# Patient Record
Sex: Male | Born: 1960 | Race: White | Hispanic: No | Marital: Married | Smoking: Former smoker
Health system: Southern US, Community
[De-identification: ages and names within clinical notes are randomized; demographics above are authoritative.]

## PROBLEM LIST (undated history)

## (undated) DIAGNOSIS — N189 Chronic kidney disease, unspecified: Secondary | ICD-10-CM

## (undated) DIAGNOSIS — I10 Essential (primary) hypertension: Secondary | ICD-10-CM

## (undated) DIAGNOSIS — G629 Polyneuropathy, unspecified: Secondary | ICD-10-CM

## (undated) DIAGNOSIS — Z87442 Personal history of urinary calculi: Secondary | ICD-10-CM

## (undated) DIAGNOSIS — K219 Gastro-esophageal reflux disease without esophagitis: Secondary | ICD-10-CM

## (undated) DIAGNOSIS — M109 Gout, unspecified: Secondary | ICD-10-CM

## (undated) HISTORY — PX: CARPAL TUNNEL RELEASE: SHX101

## (undated) HISTORY — PX: BACK SURGERY: SHX140

---

## 2005-04-06 ENCOUNTER — Inpatient Hospital Stay (HOSPITAL_COMMUNITY): Admission: RE | Admit: 2005-04-06 | Discharge: 2005-04-07 | Payer: Self-pay | Admitting: Specialist

## 2010-01-29 ENCOUNTER — Encounter: Admission: RE | Admit: 2010-01-29 | Discharge: 2010-01-29 | Payer: Self-pay | Admitting: Specialist

## 2010-10-16 NOTE — Op Note (Signed)
NAMEGALILEO, COLELLO              ACCOUNT NO.:  [id number]   MEDICAL RECORD NO.:  [id number]          PATIENT TYPE:  INP   LOCATION:  1511                         FACILITY:  [facility]   PHYSICIAN:  Jene Every, M.D.    DATE OF BIRTH:  02-27-1961   DATE OF PROCEDURE:  04/06/2005  DATE OF DISCHARGE:  04/07/2005                                 OPERATIVE REPORT   PREOPERATIVE DIAGNOSIS:  Spinal stenosis at L4-5.   POSTOPERATIVE DIAGNOSIS:  Spinal stenosis at L4-5, stenosis 3-4.   PROCEDURE PERFORMED:  1.  Lumbar decompression, bilateral hemilaminotomy, lateral recess      decompression, microdiskectomy at L4-5, right.  2.  Bilateral decompression at L3-4, microdiskectomy at 3-4, left.   BRIEF HISTORY AND INDICATIONS:  This is a 50 year old male, obese who has  had bilateral lower extremity radicular pains.  Stenosis at 4-5 was noted.  EHL weakness bilaterally.  He was also noted to have increasing left-sided  symptoms.  He had been refractory to conservative treatment including  epidural steroid injections, activity modification, etc.  His MRI indicated  lateral recess stenosis at 4-5 multifactorial with central disk protrusion.  The patient despite conservative treatment did not progress in physical  therapy and therefore was indicated for decompression.  The risks and  benefits were discussed including bleeding, infection, damage to vascular  structures, CSF leakage, epidural fibrosis, __________  need for fusion and  suture, anesthetic complications, etc.  The patient preoperatively was noted  to have fairly significant increase in his lumbosacral angle.   TECHNIQUE:  The patient in supine position, after induction of anesthesia, 2  g Kefzol, he was placed prone on the [place] frame.  All prominences were  well padded.  The abdomen was prepped and draped free.  Two 18-guage spinal  needles were utilized to localize the 4-5 interspace.  The initial needles  identified the space at  3-4.  The incision was made from the spinous process  from 3 to 5.  Subcutaneous tissue was dissected.  Bovie electrocautery was  utilized for hemostasis.  Dorsal lumbar fascia identified and divided along  the skin incision.  Paraspinous muscles were elevated from the lamina of 4-  5, partially in 3.  Placed a McCullough retractor at the region of the  presumed 4-5 interspace.  Radiograph obtained with a McCullough retractor  indicated that overseen levels were at L3-4.  There was a very small  interlaminar space there.  I digitally palpated that.  I digitally palpated  the level below.  I then moved the [facility] retractor caudad for the 4-5  interspace.  The extra long McCullough retractors were not available.  There  was some infolding from the paraspinous musculature.  I therefore brought in  the operating microscope.   I turned my attention toward the first.  I opened up the lamina.  The  cephalad edge of the lamina 5 with a 2 mm Kerrison.  Stenosis noted here,  ligamentum flavum hypertrophy.  Pars was protected.  There was fairly  significant amount of recess stenosis noted here.  In a similar fashion, I  opened up the contralateral side as well.  I performed a foraminotomy and  decompressed the lateral recess of the 2 mm Kerrison.  I examined the disk  on the right.  There was a small focal protrusion here and this was  retrieved with a micropituitary.  The remainder of the disk, however,  appeared to be intact.  Not necessarily consistent with that seen on the  initial MRI of lower central disk protrusion.  I therefore obtained  radiograph.  It was indeterminate as to the overlying level.  I subsequently  extended the incision and readjusted the [facility] retractor.  I felt that  perhaps due to the obliqueness of the lamina that decompression was required  at the level below.  Again, there was a fairly oblique 4 lamina here due to  this increased lumbosacral angle and its  anatomy; therefore, I opened up the  interspace at the level below at 4-5, decompressed the lateral recess,  performed foraminotomies.  There was fairly significant stenosis noted here  a well, right greater than left.  Bipolar electrocautery was utilized to  achieve hemostasis.  We protected neural elements at all times.  We undercut  the facet with a 2 mm Kerrison.  Performed foraminotomies of 5.  There was  paracentral to the right disk herniation here.  I performed annulotomy and  removed copious portions of disk material from the disk space with an  upbiting and straight pituitary with multiple passes.  This decompressed the  5 root on the right.  Carrying this cephalad down to the ligamentum of  flavum.  I preserved the pars.  There was some stenosis out into the 4  foramen.  Again, I evaluated it from above, extended the foraminotomy at 4  on either side of the 4 lamina.  I felt that that could be preserved.  I  used a Hockey stick probe cephalad and caudad and it was fairly opened.  There was at least 1 cm of resurgence of the 5 root medial to the pedicle  without difficulty or tension.  There was no CSF leakage or active bleeding.  Confirmatory radiograph was obtained and his 4-5 space was more consistent  with a traditional 5-1 space due to the inclination of the lumbosacral  angle.  His interspace at 5-1 was essentially vertical oblique.  Decompression was performed bilaterally.  No CSF leakage or active bleeding.  Copiously irrigated all disk space with antibiotic irrigation, 3-4 and 4-5.  Bone wax was placed on the cancellous surfaces.  Thrombin-soaked Gelfoam in  the interlaminar spaces.  On removal of the McCullough retractor,  paraspinous muscles were inspected, no evidence of active bleeding.  The  dorsolumbar fascia reapproximated with #1 Vicryl interrupted figure-of-eight  sutures.  The subcutaneous tissue was reapproximated with 2-0 Vicodin simple sutures.  The skin was  reapproximated with staples.  The wound was dressed  sterilely.  Irrigated paraspinous musculature as well.  The patient was  placed supine on the hospital bed, extubated, without difficulty, and  transported to the recovery room in satisfactory condition.  The patient  tolerated the procedure well, no complications.      Jene Every, M.D.  Electronically Signed     JB/MEDQ  D:  04/20/2005  T:  04/20/2005  Job:  [id number]

## 2011-07-21 ENCOUNTER — Ambulatory Visit (HOSPITAL_COMMUNITY)
Admission: RE | Admit: 2011-07-21 | Discharge: 2011-07-21 | Disposition: A | Discharge status: Home / Self Care | Payer: Worker's Compensation | Source: Ambulatory Visit | Attending: Urology | Admitting: Urology

## 2011-07-21 ENCOUNTER — Encounter (HOSPITAL_COMMUNITY): Payer: Self-pay | Admitting: Pharmacy Technician

## 2011-07-21 ENCOUNTER — Encounter (HOSPITAL_COMMUNITY)
Admission: RE | Disposition: A | Discharge status: Home / Self Care | Payer: Self-pay | Source: Ambulatory Visit | Attending: Urology

## 2011-07-21 ENCOUNTER — Encounter (HOSPITAL_COMMUNITY): Payer: Self-pay

## 2011-07-21 ENCOUNTER — Encounter (HOSPITAL_COMMUNITY): Payer: Self-pay | Admitting: Anesthesiology

## 2011-07-21 ENCOUNTER — Ambulatory Visit (HOSPITAL_COMMUNITY): Payer: Worker's Compensation | Admitting: Anesthesiology

## 2011-07-21 ENCOUNTER — Ambulatory Visit (HOSPITAL_COMMUNITY): Payer: Worker's Compensation

## 2011-07-21 ENCOUNTER — Other Ambulatory Visit: Payer: Self-pay

## 2011-07-21 ENCOUNTER — Encounter (HOSPITAL_COMMUNITY)
Admission: RE | Admit: 2011-07-21 | Discharge: 2011-07-21 | Disposition: A | Discharge status: Home / Self Care | Payer: Worker's Compensation | Source: Ambulatory Visit | Attending: Urology | Admitting: Urology

## 2011-07-21 DIAGNOSIS — E119 Type 2 diabetes mellitus without complications: Secondary | ICD-10-CM | POA: Insufficient documentation

## 2011-07-21 DIAGNOSIS — Z01812 Encounter for preprocedural laboratory examination: Secondary | ICD-10-CM | POA: Insufficient documentation

## 2011-07-21 DIAGNOSIS — N201 Calculus of ureter: Secondary | ICD-10-CM

## 2011-07-21 DIAGNOSIS — Z01810 Encounter for preprocedural cardiovascular examination: Secondary | ICD-10-CM | POA: Insufficient documentation

## 2011-07-21 DIAGNOSIS — Z79899 Other long term (current) drug therapy: Secondary | ICD-10-CM | POA: Insufficient documentation

## 2011-07-21 HISTORY — PX: STONE EXTRACTION WITH BASKET: SHX5318

## 2011-07-21 HISTORY — DX: Essential (primary) hypertension: I10

## 2011-07-21 HISTORY — DX: Polyneuropathy, unspecified: G62.9

## 2011-07-21 HISTORY — DX: Chronic kidney disease, unspecified: N18.9

## 2011-07-21 LAB — BASIC METABOLIC PANEL
BUN: 15 mg/dL (ref 6–23)
CO2: 27 mEq/L (ref 19–32)
Calcium: 9.6 mg/dL (ref 8.4–10.5)
Chloride: 106 mEq/L (ref 96–112)
Creatinine, Ser: 0.84 mg/dL (ref 0.50–1.35)
GFR calc Af Amer: >90 mL/min (ref >90)
Glucose, Bld: 111 mg/dL — ABNORMAL HIGH (ref 70–99)
Potassium: 4.6 mEq/L (ref 3.5–5.1)
Sodium: 141 mEq/L (ref 135–145)

## 2011-07-21 LAB — SURGICAL PCR SCREEN: MRSA, PCR: NEGATIVE

## 2011-07-21 LAB — GLUCOSE, CAPILLARY: Glucose-Capillary: 86 mg/dL (ref 70–99)

## 2011-07-21 LAB — HEMOGLOBIN AND HEMATOCRIT, BLOOD
HCT: 40.7 % (ref 39.0–52.0)
Hemoglobin: 13.5 g/dL (ref 13.0–17.0)

## 2011-07-21 SURGERY — CYSTOURETEROSCOPY, WITH RETROGRADE PYELOGRAM AND STENT INSERTION
Anesthesia: General | Site: Ureter | Laterality: Left | Wound class: Clean Contaminated

## 2011-07-21 MED ORDER — GLYCOPYRROLATE 0.2 MG/ML IJ SOLN
INTRAMUSCULAR | Status: AC
  Filled 2011-07-21: qty 1

## 2011-07-21 MED ORDER — FENTANYL CITRATE 0.05 MG/ML IJ SOLN
INTRAMUSCULAR | Status: AC
  Filled 2011-07-21: qty 2

## 2011-07-21 MED ORDER — SODIUM CHLORIDE 0.9 % IR SOLN
Status: DC | PRN
  Administered 2011-07-21: 3000 mL

## 2011-07-21 MED ORDER — LIDOCAINE HCL 1 % IJ SOLN
INTRAMUSCULAR | Status: DC | PRN
  Administered 2011-07-21: 40 mg via INTRADERMAL

## 2011-07-21 MED ORDER — MIDAZOLAM HCL 2 MG/2ML IJ SOLN
INTRAMUSCULAR | Status: AC
  Filled 2011-07-21: qty 2

## 2011-07-21 MED ORDER — PROPOFOL 10 MG/ML IV BOLUS
INTRAVENOUS | Status: DC | PRN
  Administered 2011-07-21: 100 mg via INTRAVENOUS
  Administered 2011-07-21: 200 mg via INTRAVENOUS
  Administered 2011-07-21: 100 mg via INTRAVENOUS

## 2011-07-21 MED ORDER — NEOSTIGMINE METHYLSULFATE 1 MG/ML IJ SOLN
INTRAMUSCULAR | Status: DC | PRN
  Administered 2011-07-21: 2 mg via INTRAVENOUS

## 2011-07-21 MED ORDER — PROPOFOL 10 MG/ML IV EMUL
INTRAVENOUS | Status: AC
  Filled 2011-07-21: qty 20

## 2011-07-21 MED ORDER — FENTANYL CITRATE 0.05 MG/ML IJ SOLN
INTRAMUSCULAR | Status: DC | PRN
  Administered 2011-07-21 (×3): 50 ug via INTRAVENOUS

## 2011-07-21 MED ORDER — SUCCINYLCHOLINE CHLORIDE 20 MG/ML IJ SOLN
INTRAMUSCULAR | Status: AC
  Filled 2011-07-21: qty 1

## 2011-07-21 MED ORDER — STERILE WATER FOR IRRIGATION IR SOLN
Status: DC | PRN
  Administered 2011-07-21: 1000 mL

## 2011-07-21 MED ORDER — MUPIROCIN 2 % EX OINT
TOPICAL_OINTMENT | CUTANEOUS | Status: AC
  Administered 2011-07-21: 12:00:00
  Filled 2011-07-21: qty 22

## 2011-07-21 MED ORDER — ROCURONIUM BROMIDE 50 MG/5ML IV SOLN
INTRAVENOUS | Status: AC
  Filled 2011-07-21: qty 1

## 2011-07-21 MED ORDER — LIDOCAINE HCL (PF) 1 % IJ SOLN
INTRAMUSCULAR | Status: AC
  Filled 2011-07-21: qty 5

## 2011-07-21 MED ORDER — LACTATED RINGERS IV SOLN
INTRAVENOUS | Status: DC
  Administered 2011-07-21: 13:00:00 via INTRAVENOUS

## 2011-07-21 MED ORDER — ONDANSETRON HCL 4 MG/2ML IJ SOLN
INTRAMUSCULAR | Status: AC
  Filled 2011-07-21: qty 2

## 2011-07-21 MED ORDER — SUCCINYLCHOLINE CHLORIDE 20 MG/ML IJ SOLN
INTRAMUSCULAR | Status: DC | PRN
  Administered 2011-07-21: 120 mg via INTRAVENOUS

## 2011-07-21 MED ORDER — NEOSTIGMINE METHYLSULFATE 1 MG/ML IJ SOLN
INTRAMUSCULAR | Status: AC
  Filled 2011-07-21: qty 10

## 2011-07-21 MED ORDER — IOHEXOL 350 MG/ML SOLN
INTRAVENOUS | Status: DC | PRN
  Administered 2011-07-21: 50 mL via INTRAVENOUS

## 2011-07-21 MED ORDER — FENTANYL CITRATE 0.05 MG/ML IJ SOLN: 25.0000 ug | INTRAMUSCULAR | Status: DC | PRN

## 2011-07-21 MED ORDER — ONDANSETRON HCL 4 MG/2ML IJ SOLN
4.0000 mg | Freq: Once | INTRAMUSCULAR | Status: AC
  Administered 2011-07-21: 4 mg via INTRAVENOUS

## 2011-07-21 MED ORDER — MIDAZOLAM HCL 2 MG/2ML IJ SOLN
1.0000 mg | INTRAMUSCULAR | Status: AC | PRN
  Administered 2011-07-21 (×3): 2 mg via INTRAVENOUS

## 2011-07-21 MED ORDER — ONDANSETRON HCL 4 MG/2ML IJ SOLN: 4.0000 mg | Freq: Once | INTRAMUSCULAR | Status: DC | PRN

## 2011-07-21 MED ORDER — GLYCOPYRROLATE 0.2 MG/ML IJ SOLN
INTRAMUSCULAR | Status: DC | PRN
  Administered 2011-07-21: .4 mg via INTRAVENOUS

## 2011-07-21 SURGICAL SUPPLY — 24 items
BAG DRAIN URO TABLE W/ADPT NS (DRAPE) ×3 IMPLANT
CATH 5 FR WEDGE TIP (UROLOGICAL SUPPLIES) ×3 IMPLANT
CLOTH BEACON ORANGE TIMEOUT ST (SAFETY) ×3 IMPLANT
DILATOR UROMAX ULTRA (MISCELLANEOUS) ×3 IMPLANT
GLOVE BIO SURGEON STRL SZ7 (GLOVE) ×3 IMPLANT
GLOVE BIOGEL PI IND STRL 7.0 (GLOVE) ×4 IMPLANT
GLOVE BIOGEL PI IND STRL 7.5 (GLOVE) ×2 IMPLANT
GLOVE BIOGEL PI INDICATOR 7.0 (GLOVE) ×2
GLOVE BIOGEL PI INDICATOR 7.5 (GLOVE) ×1
GLOVE OPTIFIT SS 6.5 STRL BRWN (GLOVE) ×3 IMPLANT
GOWN STRL REIN XL XLG (GOWN DISPOSABLE) ×3 IMPLANT
GUIDEWIRE STR PTFE COATED 0.25 (WIRE) ×3 IMPLANT
IV NS IRRIG 3000ML ARTHROMATIC (IV SOLUTION) ×6 IMPLANT
KIT ROOM TURNOVER AP CYSTO (KITS) ×3 IMPLANT
LASER FIBER DISP (UROLOGICAL SUPPLIES) IMPLANT
LASER FIBER DISP 1000U (UROLOGICAL SUPPLIES) IMPLANT
MANIFOLD NEPTUNE II (INSTRUMENTS) ×3 IMPLANT
PACK CYSTO (CUSTOM PROCEDURE TRAY) ×3 IMPLANT
PAD ARMBOARD 7.5X6 YLW CONV (MISCELLANEOUS) ×3 IMPLANT
STENT PERCUFLEX 4.8FRX24 (STENTS) ×3 IMPLANT
STONE RETRIEVAL GEMINI 2.4 FR (MISCELLANEOUS) ×3 IMPLANT
SYR CONTROL 10ML LL (SYRINGE) ×3 IMPLANT
TOWEL OR 17X26 4PK STRL BLUE (TOWEL DISPOSABLE) ×3 IMPLANT
WIRE GUIDE BENTSON .035 15CM (WIRE) IMPLANT

## 2011-07-21 NOTE — Discharge Instructions (Signed)
Kidney Stones Kidney stones (ureteral lithiasis) are deposits that form inside your kidneys. The intense pain is caused by the stone moving through the urinary tract. When the stone moves, the ureter goes into spasm around the stone. The stone is usually passed in the urine.  CAUSES   A disorder that makes certain neck glands produce too much parathyroid hormone (primary hyperparathyroidism).   A buildup of uric acid crystals.   Narrowing (stricture) of the ureter.   A kidney obstruction present at birth (congenital obstruction).   Previous surgery on the kidney or ureters.   Numerous kidney infections.  SYMPTOMS   Feeling sick to your stomach (nauseous).   Throwing up (vomiting).   Blood in the urine (hematuria).   Pain that usually spreads (radiates) to the groin.   Frequency or urgency of urination.  DIAGNOSIS   Taking a history and physical exam.   Blood or urine tests.   Computerized X-ray scan (CT scan).   Occasionally, an examination of the inside of the urinary bladder (cystoscopy) is performed.  TREATMENT   Observation.   Increasing your fluid intake.   Surgery may be needed if you have severe pain or persistent obstruction.  The size, location, and chemical composition are all important variables that will determine the proper choice of action for you. Talk to your caregiver to better understand your situation so that you will minimize the risk of injury to yourself and your kidney.  HOME CARE INSTRUCTIONS   Drink enough water and fluids to keep your urine clear or pale yellow.   Strain all urine through the provided strainer. Keep all particulate matter and stones for your caregiver to see. The stone causing the pain may be as small as a grain of salt. It is very important to use the strainer each and every time you pass your urine. The collection of your stone will allow your caregiver to analyze it and verify that a stone has actually passed.   Only take  over-the-counter or prescription medicines for pain, discomfort, or fever as directed by your caregiver.   Make a follow-up appointment with your caregiver as directed.   Get follow-up X-rays if required. The absence of pain does not always mean that the stone has passed. It may have only stopped moving. If the urine remains completely obstructed, it can cause loss of kidney function or even complete destruction of the kidney. It is your responsibility to make sure X-rays and follow-ups are completed. Ultrasounds of the kidney can show blockages and the status of the kidney. Ultrasounds are not associated with any radiation and can be performed easily in a matter of minutes.  SEEK IMMEDIATE MEDICAL CARE IF:   Pain cannot be controlled with the prescribed medicine.   You have a fever.   The severity or intensity of pain increases over 18 hours and is not relieved by pain medicine.   You develop a new onset of abdominal pain.   You feel faint or pass out.  MAKE SURE YOU:   Understand these instructions.   Will watch your condition.   Will get help right away if you are not doing well or get worse.  Document Released: 05/17/2005 Document Revised: 01/27/2011 Document Reviewed: 09/12/2009 [facility] Patient Information 2012 [place], [place].Urine Strainer This strainer is used to catch or filter out any stones found in your urine. Place the strainer under your urine stream. Save any stones or objects that you find in your urine. Place them in a  plastic or glass container to show your caregiver. The stones vary in size - some can be very small, so make sure you check the strainer carefully. Your caregiver may send the stone to the lab. When the results are back, your caregiver may recommend medicines or diet changes.  Document Released: 02/20/2004 Document Revised: 01/27/2011 Document Reviewed: 03/29/2008 [facility] Patient Information 2012 [place], [place].Ureteral Colic Ureteral colic is spasm-like  pain from the kidney or the ureter. This is often caused by a kidney stone. The pain is caused by the stone trying to get through the tubes that pass your pee. HOME CARE   Drink enough fluids to keep your pee (urine) clear or pale yellow.   Strain all your pee. A strainer will be provided. Keep anything caught in the strainer and bring it to your doctor. The stone causing the pain may be very small.   Only take medicine as told by your doctor.   Follow up with your doctor as told.  GET HELP RIGHT AWAY IF:   Pain is not controlled with medicine.   Pain continues or gets worse.   The pain changes and there is chest or belly (abdominal) pain.   You pass out (faint).   You cannot pee.   You keep throwing up (vomiting).   You have a temperature by mouth above 102 F (38.9 C), not controlled by medicine.  MAKE SURE YOU:   Understand these instructions.   Will watch this condition.   Will get help right away if you are not doing well or get worse.  Document Released: 11/03/2007 Document Revised: 01/27/2011 Document Reviewed: 11/03/2007 [facility] Patient Information 2012 [place], [place].

## 2011-07-21 NOTE — Transfer of Care (Signed)
Immediate Anesthesia Transfer of Care Note  Patient: Samuel Caldwell  Procedure(s) Performed: Procedure(s) (LRB): CYSTOSCOPY WITH RETROGRADE PYELOGRAM/URETERAL STENT PLACEMENT (Left)  Patient Location: PACU  Anesthesia Type: General  Level of Consciousness: awake, alert  and oriented  Airway & Oxygen Therapy: Patient Spontanous Breathing and Patient connected to face mask oxygen  Post-op Assessment: Report given to PACU RN  Post vital signs: Reviewed and stable  Complications: No apparent anesthesia complications

## 2011-07-21 NOTE — Brief Op Note (Signed)
07/21/2011  2:16 PM  PATIENT:  Delanna Notice  51 y.o. male  PRE-OPERATIVE DIAGNOSIS:  left ureteral calculus  POST-OPERATIVE DIAGNOSIS:  left ureteral calculus  PROCEDURE:  Procedure(s) (LRB): CYSTOSCOPY WITH RETROGRADE PYELOGRAM/URETERAL STENT PLACEMENT (Left)  SURGEON:  Surgeon(s) and Role:    * Ky Barban, MD - Primary  PHYSICIAN ASSISTANT:   ASSISTANTS: none   ANESTHESIA:   general  EBL:  Total I/O In: 300 [I.V.:300] Out: -   BLOOD ADMINISTERED:none  DRAINS: double j stent size 40F 24cms with string.   LOCAL MEDICATIONS USED:  NONE  SPECIMEN:  Source of Specimen:  stone given to the family to bring in office for stone anlysis  DISPOSITION OF SPECIMEN:stone given to the family to bring in office for analysis.  COUNTS:  YES  TOURNIQUET:  * No tourniquets in log *  DICTATION: .Other Dictation: Dictation Number dictation [id number]  PLAN OF CARE: Discharge to home after PACU  PATIENT DISPOSITION:  PACU - hemodynamically stable.   Delay start of Pharmacological VTE agent (>24hrs) due to surgical blood loss or risk of bleeding:

## 2011-07-21 NOTE — Anesthesia Preprocedure Evaluation (Addendum)
Anesthesia Evaluation  Patient identified by MRN, date of birth, ID band Patient awake    Reviewed: Allergy & Precautions, H&P , NPO status , Patient's Chart, lab work & pertinent test results  History of Anesthesia Complications Negative for: history of anesthetic complications  Airway Mallampati: II TM Distance: >3 FB     Dental  (+) Teeth Intact   Pulmonary neg pulmonary ROS, former smoker clear to auscultation        Cardiovascular hypertension, Pt. on medications Regular Normal    Neuro/Psych  Neuromuscular disease (peripheral neuropathy)    GI/Hepatic   Endo/Other  Diabetes mellitus-, Well Controlled, Type 2, Oral Hypoglycemic Agents  Renal/GU      Musculoskeletal   Abdominal   Peds  Hematology   Anesthesia Other Findings   Reproductive/Obstetrics                           Anesthesia Physical Anesthesia Plan  ASA: III  Anesthesia Plan: General   Post-op Pain Management:    Induction: Intravenous  Airway Management Planned: LMA  Additional Equipment:   Intra-op Plan:   Post-operative Plan: Extubation in OR  Informed Consent: I have reviewed the patients History and Physical, chart, labs and discussed the procedure including the risks, benefits and alternatives for the proposed anesthesia with the patient or authorized representative who has indicated his/her understanding and acceptance.     Plan Discussed with:   Anesthesia Plan Comments:         Anesthesia Quick Evaluation

## 2011-07-21 NOTE — Anesthesia Postprocedure Evaluation (Signed)
  Anesthesia Post-op Note  Patient: Samuel Caldwell  Procedure(s) Performed: Procedure(s) (LRB): CYSTOSCOPY WITH RETROGRADE PYELOGRAM/URETERAL STENT PLACEMENT (Left)  Patient Location: PACU  Anesthesia Type: General  Level of Consciousness: awake, alert  and oriented  Airway and Oxygen Therapy: Patient Spontanous Breathing and Patient connected to face mask oxygen  Post-op Pain: none  Post-op Assessment: Post-op Vital signs reviewed, Patient's Cardiovascular Status Stable, Respiratory Function Stable, Patent Airway and No signs of Nausea or vomiting  Post-op Vital Signs: Reviewed and stable  Complications: No apparent anesthesia complications

## 2011-07-21 NOTE — Progress Notes (Signed)
No change in H&P on reexamination. 

## 2011-07-21 NOTE — Anesthesia Procedure Notes (Addendum)
Procedure Name: LMA Insertion Date/Time: 07/21/2011 1:30 PM Performed by: Glynn Octave Pre-anesthesia Checklist: Patient identified, Patient being monitored, Emergency Drugs available, Timeout performed and Suction available Patient Re-evaluated:Patient Re-evaluated prior to inductionOxygen Delivery Method: Circle System Utilized Preoxygenation: Pre-oxygenation with 100% oxygen Intubation Type: IV induction Ventilation: Mask ventilation with difficulty LMA: LMA inserted LMA Size: 4.0 Number of attempts: 1 Placement Confirmation: positive ETCO2 and breath sounds checked- equal and bilateral Comments: 1330:  #4 LMA placed, unable to obtain a good seal.  Dr. Jayme Cloud called and prepared for GOT.  Mask ventilation difficult with full beard.  Able to ventilate moderately well with LMA but leak persiited.  LMA removed.   Procedure Name: Intubation Date/Time: 07/21/2011 1:40 PM Performed by: Glynn Octave Pre-anesthesia Checklist: Patient identified, Patient being monitored, Timeout performed, Emergency Drugs available and Suction available Patient Re-evaluated:Patient Re-evaluated prior to inductionOxygen Delivery Method: Circle System Utilized Preoxygenation: Pre-oxygenation with 100% oxygen Intubation Type: IV induction Ventilation: Mask ventilation with difficulty Laryngoscope Size: Mac and 3 Grade View: Grade I Tube type: Oral Tube size: 7.0 mm Number of attempts: 1 Airway Equipment and Method: stylet Placement Confirmation: ETT inserted through vocal cords under direct vision,  positive ETCO2 and breath sounds checked- equal and bilateral Secured at: 21 cm Tube secured with: Tape Dental Injury: Teeth and Oropharynx as per pre-operative assessment

## 2011-07-21 NOTE — Preoperative (Signed)
Beta Blockers   Reason not to administer Beta Blockers:Not Applicable Pt. Not on a beta blocker.

## 2011-07-21 NOTE — H&P (Signed)
NAMEAYDN, FERRARA             ACCOUNT NO.:  [id number]  MEDICAL RECORD NO.:  [id number]  LOCATION:                                 FACILITY:  PHYSICIAN:  Ky Barban, M.D.DATE OF BIRTH:  [birth year]  DATE OF ADMISSION:  07/21/2011 DATE OF DISCHARGE:  LH                             HISTORY & PHYSICAL   CHIEF COMPLAINT:  Recurrent left renal colic.  HISTORY:  This is a 51 year old gentleman who has a left renal colic for the last 1 month.  No fever, chills, or gross hematuria.  A CT scan done in [facility] shows there is a 4 mm stone in the left ureterovesical junction, causing obstruction.  No fever or chills.  He is having a lot of symptoms of frequency, urgency, discomfort in the penis and the left testicle, so I have told him there are 2 chances we can wait.  He can wait or pass the stone on its own.  I can put him on Flomax to help it or I can go ahead and remove the stone with the basket.  He want me to go ahead and remove the stone, so then I discussed the complications of stone baskets, which include ureteral perforation leading to open surgery, no guarantee about getting the stone out, and use of double-J stent.  He understands and want me to go ahead and proceed.  His wife was also present during the discussion.  PAST MEDICAL HISTORY:  He has type 2 diabetes and takes oral medicine, non-insulin.  No hypertension.  Had back surgery in 2006 on lower back.  FAMILY HISTORY:  No history of prostate cancer.  PERSONAL HISTORY:  Does not smoke.  Drinks few beers.  REVIEW OF SYSTEMS:  Unremarkable.  PHYSICAL EXAMINATION:  GENERAL:  Moderately built male, not in acute distress, fully conscious, alert, oriented. VITAL SIGNS:  Blood pressure 130/69, temperature is 97.2. CENTRAL NERVOUS SYSTEM:  No gross neurological deficit. HEAD, NECK, EYES, ENT:  Negative. CHEST:  Symmetrical. HEART:  Regular sinus rhythm.  No murmur. ABDOMEN:  Soft, flat.  Liver, spleen,  and kidneys are not palpable. There is deep tenderness in the left flank, 1+ left CVA tenderness. GENITOURINARY:  External genitalia is circumcised, meatus adequate. Testicles are normal. RECTAL:  Deferred. EXTREMITIES:  Normal.  IMPRESSION:  Left ureteral calculus in the left ureterovesical junction.  PLAN:  Cystoscopy, left retrograde pyelogram, ureteroscopic stone basket, holmium laser lithotripsy, insertion of double-J stent under anesthesia as outpatient.  Procedure, limitation, complication discussed with the patient and his wife.  They understand, want me to go ahead and proceed.  I appreciate Dr. Sherril Croon for letting me to see this patient.     Ky Barban, M.D.     MIJ/MEDQ  D:  07/20/2011  T:  07/21/2011  Job:  [id number]  cc:   Dr. Sherril Croon

## 2011-07-22 NOTE — Op Note (Signed)
Caldwell, Samuel              ACCOUNT NO.:  [id number]  MEDICAL RECORD NO.:  [id number]  LOCATION:  [facility]                          FACILITY:  [facility]  PHYSICIAN:  Ky Barban, M.D.DATE OF BIRTH:  02-Nov-1960  DATE OF PROCEDURE: DATE OF DISCHARGE:  07/21/2011                              OPERATIVE REPORT   SURGEON:  Ky Barban, MD  PREOPERATIVE DIAGNOSIS:  Left ureteral calculus.  POSTOPERATIVE DIAGNOSIS:  Left ureteral calculus.  PROCEDURE:  Cystoscopy, left retrograde pyelogram, ureteroscopic stone basket extraction, and insertion of double-J stent size 5-French 24-cm with string attached.  ANESTHESIA:  General endotracheal.  PROCEDURE:  The patient under general anesthesia, in lithotomy position. After usual prep and drape, #25 cystoscope was introduced into the bladder.  Left ureteral orifice was catheterized with a wedge catheter. Hypaque was injected under fluoroscopic control.  There was a filling defect in the lower end of the ureter.  Ureter was slightly dilated above that.  That was the filling defect of the stone.  A guidewire was passed up into the renal pelvis.  Over the guidewire, a balloon dilator was introduced into the intramural ureter, which was dilated, and the balloon was removed and I introduced short rigid ureteroscope alongside of the guidewire, went to the level of the stone.  It was engaged in a basket without any difficulty.  Then, the stone was removed just like that, it was a small stone.  I did not have to use laser.  Once the stone came out, I inserted a double-J stent over the guidewire, 5-French 24-cm, it was positioned within the renal pelvis and the bladder.  Nice loop was obtained in the renal pelvis and the bladder, and cystoscope was removed, and the string was taped to his private area.  The patient left the operating room in satisfactory condition.     Ky Barban, M.D.     MIJ/MEDQ  D:  07/21/2011  T:   07/22/2011  Job:  [id number]

## 2011-07-23 ENCOUNTER — Encounter (HOSPITAL_COMMUNITY): Payer: Self-pay | Admitting: Urology

## 2013-05-30 ENCOUNTER — Telehealth: Payer: Self-pay

## 2013-05-30 NOTE — Telephone Encounter (Signed)
Pt was referred by Dr. Sherril Croon for screening colonoscopy. LMOM for a return call.

## 2013-06-07 ENCOUNTER — Encounter (INDEPENDENT_AMBULATORY_CARE_PROVIDER_SITE_OTHER): Payer: Self-pay | Admitting: *Deleted

## 2013-06-18 ENCOUNTER — Other Ambulatory Visit (INDEPENDENT_AMBULATORY_CARE_PROVIDER_SITE_OTHER): Payer: Self-pay | Admitting: *Deleted

## 2013-06-18 ENCOUNTER — Encounter (INDEPENDENT_AMBULATORY_CARE_PROVIDER_SITE_OTHER): Payer: Self-pay | Admitting: *Deleted

## 2013-06-18 DIAGNOSIS — Z1211 Encounter for screening for malignant neoplasm of colon: Secondary | ICD-10-CM

## 2013-06-21 ENCOUNTER — Telehealth (INDEPENDENT_AMBULATORY_CARE_PROVIDER_SITE_OTHER): Payer: Self-pay | Admitting: *Deleted

## 2013-06-21 DIAGNOSIS — Z1211 Encounter for screening for malignant neoplasm of colon: Secondary | ICD-10-CM

## 2013-06-21 NOTE — Telephone Encounter (Signed)
Patient needs movi prep 

## 2013-06-22 MED ORDER — PEG-KCL-NACL-NASULF-NA ASC-C 100 G PO SOLR: 1.0000 | Freq: Once | ORAL | Status: DC

## 2013-07-10 NOTE — Telephone Encounter (Signed)
Pt is scheduled for colonoscopy with Dr. Laural Golden on 08/16/2013.

## 2013-07-18 ENCOUNTER — Encounter (HOSPITAL_COMMUNITY): Payer: Self-pay | Admitting: Pharmacy Technician

## 2013-07-19 ENCOUNTER — Encounter (HOSPITAL_COMMUNITY): Payer: Self-pay | Admitting: Pharmacy Technician

## 2013-07-25 ENCOUNTER — Telehealth (INDEPENDENT_AMBULATORY_CARE_PROVIDER_SITE_OTHER): Payer: Self-pay | Admitting: *Deleted

## 2013-07-25 NOTE — Telephone Encounter (Signed)
  Procedure: tcs  Reason/Indication:  screening  Has patient had this procedure before?  no  If so, when, by whom and where?    Is there a family history of colon cancer?  no  Who?  What age when diagnosed?    Is patient diabetic?   yes      Does patient have prosthetic heart valve?  no  Do you have a pacemaker?  no  Has patient ever had endocarditis? no  Has patient had joint replacement within last 12 months?  no  Does patient tend to be constipated or take laxatives? no  Is patient on Coumadin, Plavix and/or Aspirin? yes  Medications: asa 81 mg daily, actoplus 15/500 mg bid, invokana 300 mg daily, victoza 1.8 mg daily, lisinopril 10 mg daily, allopurinol 300mg  daily, meloxicam 15 mg daily, lyrica 150 mg daily, robaxin 750 mg, hydrocodone/acetaminophen 7.5/325 mg bid prn, multi vit, ester c  Allergies: pcn  Medication Adjustment: asa 2 days, hold actoplus evening before and morning of, hold invokana & victoza morning of,   Procedure date & time: 08/16/13 at 930

## 2013-07-25 NOTE — Telephone Encounter (Signed)
agree

## 2013-08-16 ENCOUNTER — Encounter (HOSPITAL_COMMUNITY)
Admission: RE | Disposition: A | Discharge status: Home / Self Care | Payer: Self-pay | Source: Ambulatory Visit | Attending: Internal Medicine

## 2013-08-16 ENCOUNTER — Encounter (HOSPITAL_COMMUNITY): Payer: Self-pay | Admitting: *Deleted

## 2013-08-16 ENCOUNTER — Ambulatory Visit (HOSPITAL_COMMUNITY)
Admission: RE | Admit: 2013-08-16 | Discharge: 2013-08-16 | Disposition: A | Discharge status: Home / Self Care | Payer: PRIVATE HEALTH INSURANCE | Source: Ambulatory Visit | Attending: Internal Medicine | Admitting: Internal Medicine

## 2013-08-16 DIAGNOSIS — Z79899 Other long term (current) drug therapy: Secondary | ICD-10-CM | POA: Insufficient documentation

## 2013-08-16 DIAGNOSIS — Z01812 Encounter for preprocedural laboratory examination: Secondary | ICD-10-CM | POA: Insufficient documentation

## 2013-08-16 DIAGNOSIS — K5730 Diverticulosis of large intestine without perforation or abscess without bleeding: Secondary | ICD-10-CM

## 2013-08-16 DIAGNOSIS — D126 Benign neoplasm of colon, unspecified: Secondary | ICD-10-CM

## 2013-08-16 DIAGNOSIS — N189 Chronic kidney disease, unspecified: Secondary | ICD-10-CM | POA: Insufficient documentation

## 2013-08-16 DIAGNOSIS — I129 Hypertensive chronic kidney disease with stage 1 through stage 4 chronic kidney disease, or unspecified chronic kidney disease: Secondary | ICD-10-CM | POA: Insufficient documentation

## 2013-08-16 DIAGNOSIS — Q438 Other specified congenital malformations of intestine: Secondary | ICD-10-CM | POA: Insufficient documentation

## 2013-08-16 DIAGNOSIS — Z1211 Encounter for screening for malignant neoplasm of colon: Secondary | ICD-10-CM

## 2013-08-16 DIAGNOSIS — E119 Type 2 diabetes mellitus without complications: Secondary | ICD-10-CM | POA: Insufficient documentation

## 2013-08-16 DIAGNOSIS — K644 Residual hemorrhoidal skin tags: Secondary | ICD-10-CM

## 2013-08-16 HISTORY — PX: COLONOSCOPY: SHX5424

## 2013-08-16 LAB — GLUCOSE, CAPILLARY: Glucose-Capillary: 101 mg/dL — ABNORMAL HIGH (ref 70–99)

## 2013-08-16 SURGERY — COLONOSCOPY
Anesthesia: Moderate Sedation

## 2013-08-16 MED ORDER — MEPERIDINE HCL 50 MG/ML IJ SOLN
INTRAMUSCULAR | Status: AC
  Filled 2013-08-16: qty 1

## 2013-08-16 MED ORDER — MIDAZOLAM HCL 5 MG/5ML IJ SOLN
INTRAMUSCULAR | Status: DC | PRN
  Administered 2013-08-16 (×5): 2 mg via INTRAVENOUS

## 2013-08-16 MED ORDER — SIMETHICONE 40 MG/0.6ML PO SUSP
ORAL | Status: DC | PRN
  Administered 2013-08-16: 10:00:00

## 2013-08-16 MED ORDER — MEPERIDINE HCL 50 MG/ML IJ SOLN
INTRAMUSCULAR | Status: DC | PRN
  Administered 2013-08-16 (×2): 25 mg via INTRAVENOUS

## 2013-08-16 MED ORDER — MIDAZOLAM HCL 5 MG/5ML IJ SOLN
INTRAMUSCULAR | Status: AC
  Filled 2013-08-16: qty 10

## 2013-08-16 MED ORDER — SODIUM CHLORIDE 0.9 % IV SOLN
INTRAVENOUS | Status: DC
  Administered 2013-08-16: 1000 mL via INTRAVENOUS

## 2013-08-16 NOTE — Op Note (Signed)
COLONOSCOPY PROCEDURE REPORT  PATIENT:  Samuel Caldwell  MR#:  [id number] Birthdate:  [birth year], 53 y.o., male Endoscopist:  Dr. Rogene Houston, MD Referred By:  Dr. Glenda Chroman, MD Procedure Date: 08/16/2013  Procedure:   Colonoscopy  Indications: Patient is 52 year old Caucasian male who is undergoing average risk screening colonoscopy.  Informed Consent:  The procedure and risks were reviewed with the patient and informed consent was obtained.  Medications:  Demerol 50 mg IV Versed 10 mg IV  Description of procedure:  After a digital rectal exam was performed, that colonoscope was advanced from the anus through the rectum and colon to the area of the cecum, ileocecal valve and appendiceal orifice. The cecum was deeply intubated. These structures were well-seen and photographed for the record. From the level of the cecum and ileocecal valve, the scope was slowly and cautiously withdrawn. The mucosal surfaces were carefully surveyed utilizing scope tip to flexion to facilitate fold flattening as needed. The scope was pulled down into the rectum where a thorough exam including retroflexion was performed.  Findings:   Prep excellent. Single diverticulum at cecum along with few more small ones at sigmoid colon. Redundant fold with mucosal erythema at sigmoid colon. Biopsy taken. Normal rectal mucosa. Small hemorrhoids below the dentate line.   Therapeutic/Diagnostic Maneuvers Performed:  See above  Complications:  None  Cecal Withdrawal Time:  15 minutes  Impression:  Examination performed to cecum. Single cecum diverticulum along with few more small diverticula at sigmoid colon Redundant mucosal fold at sigmoid colon with erythema. Biopsy taken.  Recommendations:  Standard instructions given. High fiber diet. I will contact patient with biopsy results and further recommendations.  Yajahira Tison U  08/16/2013 10:21 AM  CC: Dr. Glenda Chroman., MD & Dr. Rayne Du ref. provider  found

## 2013-08-16 NOTE — Discharge Instructions (Signed)
Resume usual medications and high fiber diet. No driving for 24 hours. Physician will contact you with biopsy results Have Hep C antibody drawn by primary MD at next office visit  Colonoscopy, Care After Refer to this sheet in the next few weeks. These instructions provide you with information on caring for yourself after your procedure. Your health care provider may also give you more specific instructions. Your treatment has been planned according to current medical practices, but problems sometimes occur. Call your health care provider if you have any problems or questions after your procedure. WHAT TO EXPECT AFTER THE PROCEDURE  After your procedure, it is typical to have the following:  A small amount of blood in your stool.  Moderate amounts of gas and mild abdominal cramping or bloating. HOME CARE INSTRUCTIONS  Do not drive, operate machinery, or sign important documents for 24 hours.  You may shower and resume your regular physical activities, but move at a slower pace for the first 24 hours.  Take frequent rest periods for the first 24 hours.  Walk around or put a warm pack on your abdomen to help reduce abdominal cramping and bloating.  Drink enough fluids to keep your urine clear or pale yellow.  You may resume your normal diet as instructed by your health care provider. Avoid heavy or fried foods that are hard to digest.  Avoid drinking alcohol for 24 hours or as instructed by your health care provider.  Only take over-the-counter or prescription medicines as directed by your health care provider.  If a tissue sample (biopsy) was taken during your procedure:  Do not take aspirin or blood thinners for 7 days, or as instructed by your health care provider.  Do not drink alcohol for 7 days, or as instructed by your health care provider.  Eat soft foods for the first 24 hours. SEEK MEDICAL CARE IF: You have persistent spotting of blood in your stool 2 3 days after the  procedure. SEEK IMMEDIATE MEDICAL CARE IF:  You have more than a small spotting of blood in your stool.  You pass large blood clots in your stool.  Your abdomen is swollen (distended).  You have nausea or vomiting.  You have a fever.  You have increasing abdominal pain that is not relieved with medicine. Document Released: 12/30/2003 Document Revised: 03/07/2013 Document Reviewed: 01/22/2013 [facility] Patient Information 2014 [place].

## 2013-08-16 NOTE — H&P (Signed)
Samuel Caldwell is an 53 y.o. male.   Chief Complaint: Patient is here for colonoscopy. HPI: Patient is-year-old Caucasian male comes in for screening colonoscopy. He denies abdominal pain change in bowel habits or rectal bleeding. Family history is negative for CRC.  Past Medical History  Diagnosis Date  . Hypertension   . Diabetes mellitus   . Chronic kidney disease     hx of stones  . Gout   . Neuropathy   . Neuropathy     Past Surgical History  Procedure Laterality Date  . Back surgery      l4 anf l5  . Carpal tunnel release      left  . Stone extraction with basket  07/21/2011    Procedure: STONE EXTRACTION WITH BASKET;  Surgeon: Marissa Nestle, MD;  Location: [facility];  Service: Urology;  Laterality: Left;    Family History  Problem Relation Age of Onset  . Anesthesia problems Neg Hx   . Hypotension Neg Hx   . Malignant hyperthermia Neg Hx   . Pseudochol deficiency Neg Hx    Social History:  reports that he quit smoking about 16 years ago. His smoking use included Cigarettes. He has a 10 pack-year smoking history. He does not have any smokeless tobacco history on file. He reports that he drinks alcohol. He reports that he does not use illicit drugs.  Allergies:  Allergies  Allergen Reactions  . Penicillins     Medications Prior to Admission  Medication Sig Dispense Refill  . allopurinol (ZYLOPRIM) 300 MG tablet Take 300 mg by mouth daily.      Marland Kitchen aspirin 81 MG chewable tablet Chew 81 mg by mouth daily.      Marland Kitchen Bioflavonoid Products (ESTER-C) 1000-50 MG TABS Take 1 tablet by mouth daily.      . Canagliflozin (INVOKANA) 300 MG TABS Take 300 mg by mouth daily.      . Liraglutide (VICTOZA [place]) Inject 1.8 mLs into the skin daily.      Marland Kitchen lisinopril (PRINIVIL,ZESTRIL) 10 MG tablet Take 10 mg by mouth daily.      . meloxicam (MOBIC) 15 MG tablet Take 15 mg by mouth daily.      . Multiple Vitamin (MULITIVITAMIN WITH MINERALS) TABS Take 1 tablet by mouth daily.      . peg  3350 powder (MOVIPREP) 100 G SOLR Take 1 kit (200 g total) by mouth once.  1 kit  0  . pioglitazone-metformin (ACTOPLUS MET) 15-500 MG per tablet Take 1 tablet by mouth 2 (two) times daily with a meal.      . pregabalin (LYRICA) 150 MG capsule Take 150 mg by mouth daily.         Results for orders placed during the hospital encounter of 08/16/13 (from the past 48 hour(s))  GLUCOSE, CAPILLARY     Status: Abnormal   Collection Time    08/16/13  8:24 AM      Result Value Ref Range   Glucose-Capillary 101 (*) 70 - 99 mg/dL   No results found.  ROS  Blood pressure 127/70, pulse 71, temperature 97.9 F (36.6 C), temperature source Oral, resp. rate 11, height _0  (1.702 m), weight 265 lb (120.203 kg), SpO2 96.00%. Physical Exam  Constitutional: He appears well-developed and well-nourished.  HENT:  Mouth/Throat: Oropharynx is clear and moist.  Eyes: Conjunctivae are normal. No scleral icterus.  Neck: No thyromegaly present.  Cardiovascular: Normal rate, regular rhythm and normal heart sounds.  No murmur heard. Respiratory: Effort normal and breath sounds normal.  GI: Soft. He exhibits no distension and no mass. There is no tenderness.  Musculoskeletal: He exhibits no edema.  He has multiple tattoos over upper extremities  Lymphadenopathy:    He has no cervical adenopathy.  Neurological: He is alert.  Skin: Skin is warm and dry.     Assessment/Plan Average risk screening colonoscopy.  Arihanna Estabrook U 08/16/2013, 9:42 AM

## 2013-08-20 ENCOUNTER — Encounter (HOSPITAL_COMMUNITY): Payer: Self-pay | Admitting: Internal Medicine

## 2013-08-27 ENCOUNTER — Telehealth (INDEPENDENT_AMBULATORY_CARE_PROVIDER_SITE_OTHER): Payer: Self-pay | Admitting: *Deleted

## 2013-08-27 NOTE — Telephone Encounter (Signed)
Dr.Rehman states that Samuel Caldwell is coming for a Colonoscopy. He will review her question with her at that time.

## 2013-08-27 NOTE — Telephone Encounter (Signed)
Suanne Marker would like to speak with Dr. Laural Golden about her husband's TCS. She was wondering why the TCS will be due in 5 instead of 10 years. Her return phone number is [phone].

## 2013-08-29 ENCOUNTER — Encounter (INDEPENDENT_AMBULATORY_CARE_PROVIDER_SITE_OTHER): Payer: Self-pay | Admitting: *Deleted

## 2014-05-05 ENCOUNTER — Emergency Department (HOSPITAL_COMMUNITY): Payer: PRIVATE HEALTH INSURANCE

## 2014-05-05 ENCOUNTER — Encounter (HOSPITAL_COMMUNITY): Payer: Self-pay | Admitting: Emergency Medicine

## 2014-05-05 ENCOUNTER — Observation Stay (HOSPITAL_COMMUNITY)
Admission: EM | Admit: 2014-05-05 | Discharge: 2014-05-06 | Disposition: A | Discharge status: Home / Self Care | Payer: PRIVATE HEALTH INSURANCE | Attending: Internal Medicine | Admitting: Internal Medicine

## 2014-05-05 DIAGNOSIS — R079 Chest pain, unspecified: Secondary | ICD-10-CM | POA: Diagnosis present

## 2014-05-05 DIAGNOSIS — Z79899 Other long term (current) drug therapy: Secondary | ICD-10-CM | POA: Diagnosis not present

## 2014-05-05 DIAGNOSIS — Z791 Long term (current) use of non-steroidal anti-inflammatories (NSAID): Secondary | ICD-10-CM | POA: Diagnosis not present

## 2014-05-05 DIAGNOSIS — Z23 Encounter for immunization: Secondary | ICD-10-CM | POA: Diagnosis not present

## 2014-05-05 DIAGNOSIS — Z880 Allergy status to penicillin: Secondary | ICD-10-CM | POA: Diagnosis not present

## 2014-05-05 DIAGNOSIS — G629 Polyneuropathy, unspecified: Secondary | ICD-10-CM | POA: Diagnosis not present

## 2014-05-05 DIAGNOSIS — I10 Essential (primary) hypertension: Secondary | ICD-10-CM

## 2014-05-05 DIAGNOSIS — M109 Gout, unspecified: Secondary | ICD-10-CM | POA: Diagnosis not present

## 2014-05-05 DIAGNOSIS — I129 Hypertensive chronic kidney disease with stage 1 through stage 4 chronic kidney disease, or unspecified chronic kidney disease: Secondary | ICD-10-CM | POA: Diagnosis not present

## 2014-05-05 DIAGNOSIS — Z87442 Personal history of urinary calculi: Secondary | ICD-10-CM | POA: Insufficient documentation

## 2014-05-05 DIAGNOSIS — N189 Chronic kidney disease, unspecified: Secondary | ICD-10-CM | POA: Diagnosis not present

## 2014-05-05 DIAGNOSIS — E162 Hypoglycemia, unspecified: Secondary | ICD-10-CM

## 2014-05-05 DIAGNOSIS — Z7982 Long term (current) use of aspirin: Secondary | ICD-10-CM | POA: Insufficient documentation

## 2014-05-05 DIAGNOSIS — Z87891 Personal history of nicotine dependence: Secondary | ICD-10-CM | POA: Insufficient documentation

## 2014-05-05 DIAGNOSIS — E119 Type 2 diabetes mellitus without complications: Secondary | ICD-10-CM | POA: Diagnosis not present

## 2014-05-05 DIAGNOSIS — R0789 Other chest pain: Secondary | ICD-10-CM

## 2014-05-05 HISTORY — DX: Gout, unspecified: M10.9

## 2014-05-05 LAB — CREATININE, SERUM
CREATININE: 0.92 mg/dL (ref 0.50–1.35)
GFR calc Af Amer: >90 mL/min (ref >90)

## 2014-05-05 LAB — BASIC METABOLIC PANEL
Anion gap: 13 (ref 5–15)
BUN: 15 mg/dL (ref 6–23)
CALCIUM: 9.9 mg/dL (ref 8.4–10.5)
CO2: 27 mEq/L (ref 19–32)
Chloride: 100 mEq/L (ref 96–112)
Creatinine, Ser: 0.78 mg/dL (ref 0.50–1.35)
GFR calc Af Amer: >90 mL/min (ref >90)
GLUCOSE: 101 mg/dL — AB (ref 70–99)
Potassium: 4.2 mEq/L (ref 3.7–5.3)
Sodium: 140 mEq/L (ref 137–147)

## 2014-05-05 LAB — CBC
HCT: 45.4 % (ref 39.0–52.0)
HEMATOCRIT: 45.3 % (ref 39.0–52.0)
HEMOGLOBIN: 15.6 g/dL (ref 13.0–17.0)
Hemoglobin: 15.3 g/dL (ref 13.0–17.0)
MCH: 30 pg (ref 26.0–34.0)
MCH: 30.6 pg (ref 26.0–34.0)
MCHC: 33.8 g/dL (ref 30.0–36.0)
MCHC: 34.4 g/dL (ref 30.0–36.0)
MCV: 88.8 fL (ref 78.0–100.0)
MCV: 89.2 fL (ref 78.0–100.0)
Platelets: 298 10*3/uL (ref 150–400)
Platelets: 319 10*3/uL (ref 150–400)
RBC: 5.1 MIL/uL (ref 4.22–5.81)
RBC: 5.09 MIL/uL (ref 4.22–5.81)
RDW: 13.4 % (ref 11.5–15.5)
RDW: 13.6 % (ref 11.5–15.5)
WBC: 7.7 10*3/uL (ref 4.0–10.5)
WBC: 8.4 10*3/uL (ref 4.0–10.5)

## 2014-05-05 LAB — TROPONIN I
Troponin I: <0.3 ng/mL (ref <0.30)
Troponin I: <0.3 ng/mL (ref <0.30)

## 2014-05-05 LAB — GLUCOSE, CAPILLARY: GLUCOSE-CAPILLARY: 138 mg/dL — AB (ref 70–99)

## 2014-05-05 MED ORDER — LISINOPRIL 10 MG PO TABS
10.0000 mg | ORAL_TABLET | Freq: Every day | ORAL | Status: DC
Start: 2014-05-06 — End: 2014-05-06
  Administered 2014-05-06: 10 mg via ORAL
  Filled 2014-05-05: qty 1

## 2014-05-05 MED ORDER — SODIUM CHLORIDE 0.9 % IJ SOLN: 3.0000 mL | INTRAMUSCULAR | Status: DC | PRN

## 2014-05-05 MED ORDER — PREGABALIN 75 MG PO CAPS
150.0000 mg | ORAL_CAPSULE | Freq: Every day | ORAL | Status: DC
  Administered 2014-05-06: 150 mg via ORAL
  Filled 2014-05-05: qty 2

## 2014-05-05 MED ORDER — SODIUM CHLORIDE 0.9 % IJ SOLN
3.0000 mL | Freq: Two times a day (BID) | INTRAMUSCULAR | Status: DC
  Administered 2014-05-06: 3 mL via INTRAVENOUS

## 2014-05-05 MED ORDER — ALUM & MAG HYDROXIDE-SIMETH 200-200-20 MG/5ML PO SUSP: 30.0000 mL | Freq: Four times a day (QID) | ORAL | Status: DC | PRN

## 2014-05-05 MED ORDER — ACETAMINOPHEN 325 MG PO TABS: 650.0000 mg | ORAL_TABLET | Freq: Four times a day (QID) | ORAL | Status: DC | PRN

## 2014-05-05 MED ORDER — INFLUENZA VAC SPLIT QUAD 0.5 ML IM SUSY
0.5000 mL | PREFILLED_SYRINGE | INTRAMUSCULAR | Status: AC
  Administered 2014-05-06: 0.5 mL via INTRAMUSCULAR
  Filled 2014-05-05: qty 0.5

## 2014-05-05 MED ORDER — OXYCODONE HCL 5 MG PO TABS
5.0000 mg | ORAL_TABLET | ORAL | Status: DC | PRN
  Administered 2014-05-05 – 2014-05-06 (×2): 5 mg via ORAL
  Filled 2014-05-05 (×2): qty 1

## 2014-05-05 MED ORDER — ACETAMINOPHEN 650 MG RE SUPP: 650.0000 mg | Freq: Four times a day (QID) | RECTAL | Status: DC | PRN

## 2014-05-05 MED ORDER — SODIUM CHLORIDE 0.9 % IJ SOLN: 3.0000 mL | Freq: Two times a day (BID) | INTRAMUSCULAR | Status: DC

## 2014-05-05 MED ORDER — ONDANSETRON HCL 4 MG PO TABS: 4.0000 mg | ORAL_TABLET | Freq: Four times a day (QID) | ORAL | Status: DC | PRN

## 2014-05-05 MED ORDER — ENOXAPARIN SODIUM 40 MG/0.4ML ~~LOC~~ SOLN
40.0000 mg | SUBCUTANEOUS | Status: DC
  Administered 2014-05-05: 40 mg via SUBCUTANEOUS
  Filled 2014-05-05: qty 0.4

## 2014-05-05 MED ORDER — ASPIRIN 325 MG PO TABS
325.0000 mg | ORAL_TABLET | Freq: Once | ORAL | Status: AC
  Administered 2014-05-05: 325 mg via ORAL
  Filled 2014-05-05: qty 1

## 2014-05-05 MED ORDER — NITROGLYCERIN 0.4 MG SL SUBL: 0.4000 mg | SUBLINGUAL_TABLET | SUBLINGUAL | Status: DC | PRN

## 2014-05-05 MED ORDER — ASPIRIN EC 325 MG PO TBEC
325.0000 mg | DELAYED_RELEASE_TABLET | Freq: Every day | ORAL | Status: DC
Start: 2014-05-06 — End: 2014-05-06
  Administered 2014-05-06: 325 mg via ORAL
  Filled 2014-05-05: qty 1

## 2014-05-05 MED ORDER — ONDANSETRON HCL 4 MG/2ML IJ SOLN: 4.0000 mg | Freq: Four times a day (QID) | INTRAMUSCULAR | Status: DC | PRN

## 2014-05-05 MED ORDER — SODIUM CHLORIDE 0.9 % IV SOLN: 250.0000 mL | INTRAVENOUS | Status: DC | PRN

## 2014-05-05 MED ORDER — ALLOPURINOL 300 MG PO TABS
300.0000 mg | ORAL_TABLET | Freq: Every day | ORAL | Status: DC
  Administered 2014-05-06: 300 mg via ORAL
  Filled 2014-05-05: qty 1

## 2014-05-05 MED ORDER — PNEUMOCOCCAL VAC POLYVALENT 25 MCG/0.5ML IJ INJ
0.5000 mL | INJECTION | INTRAMUSCULAR | Status: AC
  Administered 2014-05-06: 0.5 mL via INTRAMUSCULAR
  Filled 2014-05-05: qty 0.5

## 2014-05-05 MED ORDER — METHOCARBAMOL 500 MG PO TABS
750.0000 mg | ORAL_TABLET | Freq: Four times a day (QID) | ORAL | Status: DC | PRN
Start: 2014-05-05 — End: 2014-05-06

## 2014-05-05 NOTE — H&P (Signed)
[facility] History and Physical  HOWARD BUNTE DXI:[id number] DOB: 1960-11-15 DOA: 05/05/2014  Referring physician:  PCP: Glenda Chroman., MD   Chief Complaint: Chest pain  HPI: Samuel Caldwell is a 53 y.o. male a past medical history of type 2 diabetes mellitus, hypertension, DJD of back, presenting to the emergency department with complaints of chest pain. He reports his chest pain started this morning located in the midsternal and left chest region characterized as constant, tight/pressure like associated with diaphoresis. He also complains of associated left arm pain that travels from his left shoulder. He denies nausea or vomiting. He denies prior episodes of chest pain, reporting that prior to today he could exert himself without having chest pain or significant shortness of breath. He also reports several episodes of hypoglycemia over the past several days with blood sugars getting low as 30. His wife reporting mental status changes during these episodes. He currently takes pioglitazone/metformin, victoza and invokana, without any changes to his diet. In the emergency room EKG did not show acute ischemic changes, troponin within normal limits and chest x-ray not showing acute cardiopulmonary disease.                                                                                                                                                                 Review of Systems:  Constitutional:  No weight loss, night sweats, Fevers, chills, fatigue.  HEENT:  No headaches, Difficulty swallowing,Tooth/dental problems,Sore throat,  No sneezing, itching, ear ache, nasal congestion, post nasal drip,  Cardio-vascular:  Positive for chest pain, Orthopnea, PND, swelling in lower extremities, anasarca, dizziness, palpitations  GI:  No heartburn, indigestion, abdominal pain, nausea, vomiting, diarrhea, change in bowel habits, loss of appetite  Resp:  No shortness of breath with exertion or  at rest. No excess mucus, no productive cough, No non-productive cough, No coughing up of blood.No change in color of mucus.No wheezing.No chest wall deformity  Skin:  no rash or lesions.  GU:  no dysuria, change in color of urine, no urgency or frequency. No flank pain.  Musculoskeletal:  No joint pain or swelling. No decreased range of motion. Positive for back pain.  Psych:  No change in mood or affect. No depression or anxiety. No memory loss.   Past Medical History  Diagnosis Date  . Hypertension   . Diabetes mellitus   . Chronic kidney disease     hx of stones  . Gout   . Neuropathy   . Neuropathy   . Gout    Past Surgical History  Procedure Laterality Date  . Back surgery      l4 anf l5  . Carpal tunnel release      left  . Stone extraction with basket  07/21/2011  Procedure: STONE EXTRACTION WITH BASKET;  Surgeon: Marissa Nestle, MD;  Location: [facility];  Service: Urology;  Laterality: Left;  . Colonoscopy N/A 08/16/2013    Procedure: COLONOSCOPY;  Surgeon: Rogene Houston, MD;  Location: [facility];  Service: Endoscopy;  Laterality: N/A;  930   Social History:  reports that he quit smoking about 16 years ago. His smoking use included Cigarettes. He has a 10 pack-year smoking history. He does not have any smokeless tobacco history on file. He reports that he drinks alcohol. He reports that he does not use illicit drugs.  Allergies  Allergen Reactions  . Penicillins Anaphylaxis, Hives and Swelling    Family History  Problem Relation Age of Onset  . Anesthesia problems Neg Hx   . Hypotension Neg Hx   . Malignant hyperthermia Neg Hx   . Pseudochol deficiency Neg Hx      Prior to Admission medications   Medication Sig Start Date End Date Taking? Authorizing Provider  allopurinol (ZYLOPRIM) 300 MG tablet Take 300 mg by mouth daily.   Yes Historical Provider, MD  aspirin EC 81 MG tablet Take 81 mg by mouth daily.   Yes Historical Provider, MD  Bioflavonoid  Products (ESTER-C) 1000-50 MG TABS Take 1 tablet by mouth daily.   Yes Historical Provider, MD  Canagliflozin (INVOKANA) 300 MG TABS Take 300 mg by mouth daily.   Yes Historical Provider, MD  HYDROcodone-acetaminophen (NORCO) 7.5-325 MG per tablet Take 0.5-1 tablets by mouth every 6 (six) hours as needed for moderate pain.   Yes Historical Provider, MD  Liraglutide (VICTOZA [place]) Inject 1.8 mLs into the skin daily.   Yes Historical Provider, MD  lisinopril (PRINIVIL,ZESTRIL) 10 MG tablet Take 10 mg by mouth daily.   Yes Historical Provider, MD  meloxicam (MOBIC) 15 MG tablet Take 15 mg by mouth daily.   Yes Historical Provider, MD  methocarbamol (ROBAXIN) 750 MG tablet Take 750 mg by mouth 4 (four) times daily as needed for muscle spasms.   Yes Historical Provider, MD  Multiple Vitamin (MULITIVITAMIN WITH MINERALS) TABS Take 1 tablet by mouth daily.   Yes Historical Provider, MD  pioglitazone-metformin (ACTOPLUS MET) 15-500 MG per tablet Take 1 tablet by mouth 2 (two) times daily with a meal.   Yes Historical Provider, MD  pregabalin (LYRICA) 150 MG capsule Take 150 mg by mouth daily.    Yes Historical Provider, MD  tiZANidine (ZANAFLEX) 4 MG tablet Take 4 mg by mouth every 6 (six) hours as needed for muscle spasms.   Yes Historical Provider, MD   Physical Exam: Filed Vitals:   05/05/14 1928 05/05/14 1930 05/05/14 2000 05/05/14 2015  BP:  137/79 132/75   Pulse: 78 78 84 95  Temp:      TempSrc:      Resp: _0 Height:      Weight:      SpO2: 100% 99% 98% 98%    Wt Readings from Last 3 Encounters:  05/05/14 122.471 kg (270 lb)  08/16/13 120.203 kg (265 lb)  07/21/11 120.203 kg (265 lb)    General:  Appears calm and comfortable, in no acute distress. Eyes: PERRL, normal lids, irises & conjunctiva ENT: grossly normal hearing, lips & tongue Neck: no LAD, masses or thyromegaly Cardiovascular: RRR, no m/r/g. No LE edema. Telemetry: SR, no arrhythmias  Respiratory: CTA bilaterally,  no w/r/r. Normal respiratory effort. Abdomen: soft, ntnd Skin: no rash or induration seen on limited exam Musculoskeletal: grossly normal  tone BUE/BLE Psychiatric: grossly normal mood and affect, speech fluent and appropriate Neurologic: grossly non-focal.          Labs on Admission:  Basic Metabolic Panel:  Recent Labs Lab 05/05/14 1900  NA 140  K 4.2  CL 100  CO2 27  GLUCOSE 101*  BUN 15  CREATININE 0.78  CALCIUM 9.9   Liver Function Tests: No results for input(s): AST, ALT, ALKPHOS, BILITOT, PROT, ALBUMIN in the last 168 hours. No results for input(s): LIPASE, AMYLASE in the last 168 hours. No results for input(s): AMMONIA in the last 168 hours. CBC:  Recent Labs Lab 05/05/14 1900  WBC 7.7  HGB 15.3  HCT 45.3  MCV 88.8  PLT 298   Cardiac Enzymes:  Recent Labs Lab 05/05/14 1900  TROPONINI <0.30    BNP (last 3 results) No results for input(s): PROBNP in the last 8760 hours. CBG: No results for input(s): GLUCAP in the last 168 hours.  Radiological Exams on Admission: Dg Chest 2 View  05/05/2014   CLINICAL DATA:  Mid chest pain radiating down the left arm, tingling, numbness in both hands, shortness of breath for 2 days  EXAM: CHEST  2 VIEW  COMPARISON:  None.  FINDINGS: The heart size and mediastinal contours are within normal limits. Both lungs are clear. The visualized skeletal structures are unremarkable.  IMPRESSION: No active cardiopulmonary disease.   Electronically Signed   By: Kathreen Devoid   On: 05/05/2014 17:51    EKG: Independently reviewed.   Assessment/Plan Principal Problem:   Chest pain Active Problems:   Diabetes mellitus   Hypoglycemia   HTN (hypertension)   1. Chest pain. Patient presented with chest pain having atypical features. He denies having history of chest pain with symptoms starting this morning and remaining constant over the course of the day. He has multiple cardiovascular risk factors including hypertension and  diabetes. Will place Mr Hyder and overnight observation, with continuous cardiac monitoring, cycle troponins, repeat EKG and obtain a fasting lipid panel in morning. Continue aspirin 325 mg by mouth daily along with lisinopril. As needed nitroglycerin for chest discomfort.  2. Type 2 diabetes mellitus. Patient reporting several episodes of hypoglycemia over the past 2 days, with blood sugars as low as 30. In the emergency room he was found have a blood sugar of 101. At home he had been taking pioglitazone/metformin, victoza and invokana, which will be held. Will monitor his blood sugars, provide sliding scale coverage.  3. Hypertension. Continue lisinopril 10 mg by mouth daily 4. DVT prophylaxis. Lovenox  Code Status: Full code Family Communication: Spoke with his wife was present at bedside Disposition Plan: Will place patient in 24-hour observation  Time spent: 55 min  Kelvin Cellar [facility] Pager [phone]

## 2014-05-05 NOTE — ED Notes (Addendum)
Pt reports chest pain for last several days with increase in intensity since today. Pt significant other reports intermittent confusion since yesterday. Mild dyspnea noted in triage.non-diaphoretic.pt reports mid sternal chest pain radiating to left arm. Pt reports numbness/tingling in BUE.

## 2014-05-05 NOTE — ED Provider Notes (Signed)
CSN: [id number]     Arrival date & time 05/05/14  1712 History   First MD Initiated Contact with Patient 05/05/14 1838     Chief Complaint  Patient presents with  . Chest Pain     (Consider location/radiation/quality/duration/timing/severity/associated sxs/prior Treatment) HPI.... Pressure sensation anterior chest with radiation to left arm and to the bilateral scapular region for last several days.. Patient is diabetic and has had low blood sugars the last 24 hours. Review of systems positive for dyspnea and diaphoresis. Nonsmoker. No previous heart troubles. Severity is moderate. Nothing makes symptoms better or worse.  Risk factors include diabetes and hypertension.  Past Medical History  Diagnosis Date  . Hypertension   . Diabetes mellitus   . Chronic kidney disease     hx of stones  . Gout   . Neuropathy   . Neuropathy   . Gout    Past Surgical History  Procedure Laterality Date  . Back surgery      l4 anf l5  . Carpal tunnel release      left  . Stone extraction with basket  07/21/2011    Procedure: STONE EXTRACTION WITH BASKET;  Surgeon: Marissa Nestle, MD;  Location: [facility];  Service: Urology;  Laterality: Left;  . Colonoscopy N/A 08/16/2013    Procedure: COLONOSCOPY;  Surgeon: Rogene Houston, MD;  Location: [facility];  Service: Endoscopy;  Laterality: N/A;  930   Family History  Problem Relation Age of Onset  . Anesthesia problems Neg Hx   . Hypotension Neg Hx   . Malignant hyperthermia Neg Hx   . Pseudochol deficiency Neg Hx    History  Substance Use Topics  . Smoking status: Former Smoker -- 1.00 packs/day for 10 years    Types: Cigarettes    Quit date: 07/20/1997  . Smokeless tobacco: Not on file  . Alcohol Use: Yes     Comment: occasssional    Review of Systems  All other systems reviewed and are negative.     Allergies  Penicillins  Home Medications   Prior to Admission medications   Medication Sig Start Date End Date Taking?  Authorizing Provider  allopurinol (ZYLOPRIM) 300 MG tablet Take 300 mg by mouth daily.   Yes Historical Provider, MD  aspirin EC 81 MG tablet Take 81 mg by mouth daily.   Yes Historical Provider, MD  Bioflavonoid Products (ESTER-C) 1000-50 MG TABS Take 1 tablet by mouth daily.   Yes Historical Provider, MD  Canagliflozin (INVOKANA) 300 MG TABS Take 300 mg by mouth daily.   Yes Historical Provider, MD  HYDROcodone-acetaminophen (NORCO) 7.5-325 MG per tablet Take 0.5-1 tablets by mouth every 6 (six) hours as needed for moderate pain.   Yes Historical Provider, MD  Liraglutide (VICTOZA [place]) Inject 1.8 mLs into the skin daily.   Yes Historical Provider, MD  lisinopril (PRINIVIL,ZESTRIL) 10 MG tablet Take 10 mg by mouth daily.   Yes Historical Provider, MD  meloxicam (MOBIC) 15 MG tablet Take 15 mg by mouth daily.   Yes Historical Provider, MD  methocarbamol (ROBAXIN) 750 MG tablet Take 750 mg by mouth 4 (four) times daily as needed for muscle spasms.   Yes Historical Provider, MD  Multiple Vitamin (MULITIVITAMIN WITH MINERALS) TABS Take 1 tablet by mouth daily.   Yes Historical Provider, MD  pioglitazone-metformin (ACTOPLUS MET) 15-500 MG per tablet Take 1 tablet by mouth 2 (two) times daily with a meal.   Yes Historical Provider, MD  pregabalin (LYRICA) 150  MG capsule Take 150 mg by mouth daily.    Yes Historical Provider, MD  tiZANidine (ZANAFLEX) 4 MG tablet Take 4 mg by mouth every 6 (six) hours as needed for muscle spasms.   Yes Historical Provider, MD   BP 126/83 mmHg  Pulse 80  Temp(Src) 97.9 F (36.6 C) (Oral)  Resp 12  Ht 5' 9"  (1.753 m)  Wt 270 lb (122.471 kg)  BMI 39.85 kg/m2  SpO2 99% Physical Exam  Constitutional: He is oriented to person, place, and time. He appears well-developed and well-nourished.  HENT:  Head: Normocephalic and atraumatic.  Eyes: Conjunctivae and EOM are normal. Pupils are equal, round, and reactive to light.  Neck: Normal range of motion. Neck supple.   Cardiovascular: Normal rate, regular rhythm and normal heart sounds.   Pulmonary/Chest: Effort normal and breath sounds normal.  Abdominal: Soft. Bowel sounds are normal.  Musculoskeletal: Normal range of motion.  Neurological: He is alert and oriented to person, place, and time.  Skin: Skin is warm and dry.  Psychiatric: He has a normal mood and affect. His behavior is normal.  Nursing note and vitals reviewed.   ED Course  Procedures (including critical care time) Labs Review Labs Reviewed  BASIC METABOLIC PANEL - Abnormal; Notable for the following:    Glucose, Bld 101 (*)    All other components within normal limits  CBC  TROPONIN I    Imaging Review Dg Chest 2 View  05/05/2014   CLINICAL DATA:  Mid chest pain radiating down the left arm, tingling, numbness in both hands, shortness of breath for 2 days  EXAM: CHEST  2 VIEW  COMPARISON:  None.  FINDINGS: The heart size and mediastinal contours are within normal limits. Both lungs are clear. The visualized skeletal structures are unremarkable.  IMPRESSION: No active cardiopulmonary disease.   Electronically Signed   By: Kathreen Devoid   On: 05/05/2014 17:51     EKG Interpretation None      MDM   Final diagnoses:  Chest pain    New-onset chest pain with risk factors of hypertension and diabetes.. Also bouts of hypoglycemia which have been poorly defined.   Admit to general medicine.    Nat Christen, MD 05/05/14 2056

## 2014-05-05 NOTE — ED Notes (Signed)
Pt's lab have not been drawn. Lab contacted and reported phlebotomist was on the way to draw pt's labs.

## 2014-05-06 ENCOUNTER — Encounter (HOSPITAL_COMMUNITY): Payer: Self-pay | Admitting: Adult Health

## 2014-05-06 ENCOUNTER — Telehealth: Payer: Self-pay

## 2014-05-06 DIAGNOSIS — R072 Precordial pain: Secondary | ICD-10-CM

## 2014-05-06 DIAGNOSIS — R079 Chest pain, unspecified: Secondary | ICD-10-CM

## 2014-05-06 LAB — HEPATIC FUNCTION PANEL
ALBUMIN: 3.9 g/dL (ref 3.5–5.2)
ALK PHOS: 70 U/L (ref 39–117)
ALT: 25 U/L (ref 0–53)
AST: 17 U/L (ref 0–37)
BILIRUBIN TOTAL: 0.4 mg/dL (ref 0.3–1.2)
Bilirubin, Direct: <0.2 mg/dL (ref 0.0–0.3)
TOTAL PROTEIN: 7.1 g/dL (ref 6.0–8.3)

## 2014-05-06 LAB — BASIC METABOLIC PANEL
Anion gap: 13 (ref 5–15)
BUN: 18 mg/dL (ref 6–23)
CO2: 25 mEq/L (ref 19–32)
CREATININE: 0.86 mg/dL (ref 0.50–1.35)
Calcium: 9.2 mg/dL (ref 8.4–10.5)
Chloride: 101 mEq/L (ref 96–112)
Glucose, Bld: 120 mg/dL — ABNORMAL HIGH (ref 70–99)
POTASSIUM: 4.2 meq/L (ref 3.7–5.3)
Sodium: 139 mEq/L (ref 137–147)

## 2014-05-06 LAB — CBC
HCT: 44.4 % (ref 39.0–52.0)
Hemoglobin: 14.5 g/dL (ref 13.0–17.0)
MCH: 29.2 pg (ref 26.0–34.0)
MCHC: 32.7 g/dL (ref 30.0–36.0)
MCV: 89.5 fL (ref 78.0–100.0)
PLATELETS: 293 10*3/uL (ref 150–400)
RBC: 4.96 MIL/uL (ref 4.22–5.81)
RDW: 13.9 % (ref 11.5–15.5)
WBC: 7.9 10*3/uL (ref 4.0–10.5)

## 2014-05-06 LAB — LIPID PANEL
CHOLESTEROL: 174 mg/dL (ref 0–200)
HDL: 41 mg/dL (ref >39)
LDL Cholesterol: 78 mg/dL (ref 0–99)
Total CHOL/HDL Ratio: 4.2 RATIO
Triglycerides: 274 mg/dL — ABNORMAL HIGH (ref <150)
VLDL: 55 mg/dL — ABNORMAL HIGH (ref 0–40)

## 2014-05-06 LAB — TROPONIN I: Troponin I: <0.3 ng/mL (ref <0.30)

## 2014-05-06 LAB — HEMOGLOBIN A1C
Hgb A1c MFr Bld: 6.6 % — ABNORMAL HIGH (ref <5.7)
Mean Plasma Glucose: 143 mg/dL — ABNORMAL HIGH (ref <117)

## 2014-05-06 LAB — GLUCOSE, CAPILLARY
GLUCOSE-CAPILLARY: 117 mg/dL — AB (ref 70–99)
Glucose-Capillary: 133 mg/dL — ABNORMAL HIGH (ref 70–99)

## 2014-05-06 LAB — TSH: TSH: 2.11 u[IU]/mL (ref 0.350–4.500)

## 2014-05-06 LAB — MAGNESIUM: Magnesium: 2.3 mg/dL (ref 1.5–2.5)

## 2014-05-06 MED ORDER — ASPIRIN EC 81 MG PO TBEC: 81.0000 mg | DELAYED_RELEASE_TABLET | Freq: Every day | ORAL | Status: DC

## 2014-05-06 MED ORDER — ATORVASTATIN CALCIUM 40 MG PO TABS: 40.0000 mg | ORAL_TABLET | Freq: Every day | ORAL | Status: DC

## 2014-05-06 MED ORDER — ATORVASTATIN CALCIUM 40 MG PO TABS
40.0000 mg | ORAL_TABLET | Freq: Every day | ORAL | Status: DC
Start: 2014-05-06 — End: 2014-05-06
  Administered 2014-05-06: 40 mg via ORAL
  Filled 2014-05-06: qty 1

## 2014-05-06 MED ORDER — ESOMEPRAZOLE MAGNESIUM 40 MG PO PACK: 40.0000 mg | PACK | Freq: Every day | ORAL | Status: DC

## 2014-05-06 MED ORDER — ATORVASTATIN CALCIUM 40 MG PO TABS: 40.0000 mg | ORAL_TABLET | Freq: Every day | ORAL | Status: AC

## 2014-05-06 NOTE — Telephone Encounter (Signed)
Orders placed per request for ECHO and Stress Cardiolite

## 2014-05-06 NOTE — Plan of Care (Signed)
Problem: Phase I Progression Outcomes Goal: MD aware of Cardiac Marker results Outcome: Completed/Met Date Met:  05/06/14

## 2014-05-06 NOTE — Consult Note (Signed)
CARDIOLOGY CONSULT NOTE   Patient ID: ZANDER INGHAM MRN: [id number] DOB/AGE: December 28, 1960 53 y.o.  Admit Date: 05/05/2014 Referring Physician: PTH Primary Physician: Glenda Chroman., MD Consulting Cardiologist: Carlyle Dolly MD Primary Cardiologist: New Reason for Consultation: Chest Pain with multiple CVRF  Clinical Summary Mr. Springborn is a 53 y.o.male with no prior cardiac history but with history of diabetes, obesity, hypertension who was in his usual state of health until yesterday. He was sitting when he felt bilateral chest pressure, followed by a sharp pain on the left radiating to back in between shoulder blades with an achy feeling in his left arm. Chest pressure began around 10am and lasted non-stop until arrival to ER around 5 pm. Intermittent sharp pain all day as well. States he felt his heart racing or pounding at times.   On arrival to ER, BP 136/80 HR 97 bpm. Labs unremarkable. CXR normal. He was given ASA 325 mg and admitted for observation.  He has had intermittent pressure in his chest and pounding feeling throughout the night. Review of telemetry demonstrated sinus bradycardia 50's 60' without arrhythmias or rapid HR. EKG demonstrated NSR with PVC's.   He states that he has GERD and was taking Nexium in the past, but has not for approx 2 years. He is followed by Dr. Laural Golden.     Allergies  Allergen Reactions  . Penicillins Anaphylaxis, Hives and Swelling    Medications Scheduled Medications: . allopurinol  300 mg Oral Daily  . aspirin EC  325 mg Oral Daily  . enoxaparin (LOVENOX) injection  40 mg Subcutaneous Q24H  . lisinopril  10 mg Oral Daily  . pregabalin  150 mg Oral Daily  . sodium chloride  3 mL Intravenous Q12H  . sodium chloride  3 mL Intravenous Q12H      PRN Medications: sodium chloride, acetaminophen **OR** acetaminophen, alum & mag hydroxide-simeth, methocarbamol, nitroGLYCERIN, ondansetron **OR** ondansetron (ZOFRAN) IV, oxyCODONE, sodium  chloride   Past Medical History  Diagnosis Date  . Hypertension   . Diabetes mellitus   . Chronic kidney disease     hx of stones  . Gout   . Neuropathy   . Gout     Past Surgical History  Procedure Laterality Date  . Back surgery      l4 anf l5  . Carpal tunnel release      left  . Stone extraction with basket  07/21/2011    Procedure: STONE EXTRACTION WITH BASKET;  Surgeon: Marissa Nestle, MD;  Location: [facility];  Service: Urology;  Laterality: Left;  . Colonoscopy N/A 08/16/2013    Procedure: COLONOSCOPY;  Surgeon: Rogene Houston, MD;  Location: [facility];  Service: Endoscopy;  Laterality: N/A;  930    Family History  Problem Relation Age of Onset  . Anesthesia problems Neg Hx   . Hypotension Neg Hx   . Malignant hyperthermia Neg Hx   . Pseudochol deficiency Neg Hx     Social History Mr. Fluegel reports that he quit smoking about 16 years ago. His smoking use included Cigarettes. He has a 10 pack-year smoking history. He does not have any smokeless tobacco history on file. Mr. Ringel reports that he drinks alcohol.  Review of Systems Complete review of systems are found to be negative unless outlined in H&P above.  Physical Examination Blood pressure 131/83, pulse 74, temperature 98.7 F (37.1 C), temperature source Oral, resp. rate 18, height 5\' 9"  (1.753 m), weight 275 lb 9.6 oz (  125.011 kg), SpO2 97 %.  Intake/Output Summary (Last 24 hours) at 05/06/14 1045 Last data filed at 05/06/14 1007  Gross per 24 hour  Intake      3 ml  Output    650 ml  Net   -647 ml    Telemetry: Sinus bradycardia   GEN:No acute distress HEENT: Conjunctiva and lids normal, oropharynx clear with moist mucosa. Neck: Supple, no elevated JVP, very soft right carotid bruit, no thyromegaly. Lungs: Clear to auscultation, nonlabored breathing at rest. Pain with inspiration Cardiac: Regular rate and rhythm, soft systolic murmur, no S3 or significant systolic murmur, no pericardial  rub. Abdomen: Soft, nontender, no hepatomegaly, bowel sounds present, no guarding or rebound. Extremities: No pitting edema, distal pulses 2+. Skin: Warm and dry. Musculoskeletal: No kyphosis. Neuropsychiatric: Alert and oriented x3, affect grossly appropriate.  Prior Cardiac Testing/Procedures NONE  Lab Results  Basic Metabolic Panel:  Recent Labs Lab 05/05/14 1900 05/05/14 2120 05/06/14 0347  NA 140  --  139  K 4.2  --  4.2  CL 100  --  101  CO2 27  --  25  GLUCOSE 101*  --  120*  BUN 15  --  18  CREATININE 0.78 0.92 0.86  CALCIUM 9.9  --  9.2   CBC:  Recent Labs Lab 05/05/14 1900 05/05/14 2120 05/06/14 0347  WBC 7.7 8.4 7.9  HGB 15.3 15.6 14.5  HCT 45.3 45.4 44.4  MCV 88.8 89.2 89.5  PLT 298 319 293    Cardiac Enzymes:  Recent Labs Lab 05/05/14 1900 05/05/14 2120 05/06/14 0347 05/06/14 0858  TROPONINI <0.30 <0.30 <0.30 <0.30    Radiology: Dg Chest 2 View  05/05/2014   CLINICAL DATA:  Mid chest pain radiating down the left arm, tingling, numbness in both hands, shortness of breath for 2 days  EXAM: CHEST  2 VIEW  COMPARISON:  None.  FINDINGS: The heart size and mediastinal contours are within normal limits. Both lungs are clear. The visualized skeletal structures are unremarkable.  IMPRESSION: No active cardiopulmonary disease.   Electronically Signed   By: Kathreen Devoid   On: 05/05/2014 17:51     ECG: Sinus rhythm with PVC's. Questionable inferior Q-wave   Impression and Recommendations:  1.Atypical Chest Pain: Constant pressure with intermittent sharp pain radiating into the back between the shoulder blades with some aching in the left arm. On reivew of telemetry he is bradycardic without rate control medications. No arrhythmia's or rapid HR overnight.  I will check TSH.   Troponin is negative X 4, arguing against ACS. EKG essentially normal. Check magnesium.  With CVRF to include diabetes, hypertension, obesity, he would benefit from OP  stress.  2. GERD: He has history of same with worsening symptoms over the last 4-5 months. "Just eating an apple with cause me to have heart burn."  Would recommend that he see GI physician to be re-evaluated. He still has gallbladder, but is negative for Murphy's sign and LFT's are normal.   3. Hypertension: Currently well controlled. He states his PCP put him on ACE due to his diabetes to be protective.   4.Chronic Back Pain:  On muscle relaxers and NSAIDS at home for pain control.        Signed: Phill Myron. Lawrence NP [place]  05/06/2014, 10:45 AM Co-Sign MD  Patient seen and discussed with NP Purcell Nails, I agree with her documentation above. 53 yo male hx of DM, HTN admitted with chest pain. Symptoms started yesterday around 10AM.  Tightness/sharp pain in mid and left chest 8/10, radiating to shoulder bladed. Worst with deep breathing. Constant pain lasting over 24 hours. Reports recent increased in belching with acidic/burning taste in his mouth  TSH pending K 4.2 Cr 0.78 BUN 15 Hgb 15.3 PLT 298 trop neg x 4 TC 174 TG 274 HDL 41 LDL 78 CXR no acute process EKG NSR with PVCs, possible old anteroseptal infarct  Atypical chest pain in duration of 24 hours, worst with deep breathing. Recent increase/association with belching with burning/acidic taste suggests possible GI etiology. No evidence of ACS by EKG or enzymes Given his hx of DM he is at risk of atypical angina and recommend outpatient GXT and echo. Decrease ASA to 81mg  daily for primary prevention, recommend moderate dose statin given his history of DM, start atorva 40mg  daily consistent with most recent lipid guidelines. Will arrange outpatient GXT, echo and f/u with NP Lawrence in 1 week. [place] for discharge from cardiac standpoint. Would start H2 blocker or PPI for possible GI etiology, defer selection to primary team.    Zandra Abts MD

## 2014-05-06 NOTE — Progress Notes (Signed)
UR completed 

## 2014-05-06 NOTE — Plan of Care (Signed)
Problem: Phase I Progression Outcomes Goal: Hemodynamically stable Outcome: Completed/Met Date Met:  05/06/14     

## 2014-05-06 NOTE — Discharge Summary (Signed)
Physician Discharge Summary  PRATHAM CASSATT KMQ:[id number] DOB: [birth year] DOA: 05/05/2014  PCP: Glenda Chroman., MD  Admit date: 05/05/2014 Discharge date: 05/06/2014  Time spent: 45 minutes  Recommendations for Outpatient Follow-up:  -Will be discharged home today. -Advised to follow up with PCP in 1 week to follow CBGs. -Advised to follow up with Dr. Harl Bowie in  1 week for ECHO and stress test.  Discharge Diagnoses:  Principal Problem:   Chest pain Active Problems:   Diabetes mellitus   Hypoglycemia   HTN (hypertension)   Discharge Condition: Stable and improved  Filed Weights   05/05/14 1724 05/05/14 2120  Weight: 122.471 kg (270 lb) 125.011 kg (275 lb 9.6 oz)    History of present illness:  Samuel Caldwell is a 53 y.o. male a past medical history of type 2 diabetes mellitus, hypertension, DJD of back, presenting to the emergency department with complaints of chest pain. He reports his chest pain started this morning located in the midsternal and left chest region characterized as constant, tight/pressure like associated with diaphoresis. He also complains of associated left arm pain that travels from his left shoulder. He denies nausea or vomiting. He denies prior episodes of chest pain, reporting that prior to today he could exert himself without having chest pain or significant shortness of breath. He also reports several episodes of hypoglycemia over the past several days with blood sugars getting low as 30. His wife reporting mental status changes during these episodes. He currently takes pioglitazone/metformin, victoza and invokana, without any changes to his diet. In the emergency room EKG did not show acute ischemic changes, troponin within normal limits and chest x-ray not showing acute cardiopulmonary disease.  Hospital Course:   Chest pain -Resolved. -Ruled out for acute coronary syndrome with 3 sets of negative troponins and EKG without  acute ischemic abnormality. -Has been evaluated by cardiology with plans for outpatient follow-up in one week for echocardiogram and stress test.  Type 2 diabetes mellitus -With frequent hypoglycemic episodes. -I decided to discontinue Victoza and Invokana and will continue the pioglitazone/metformin for now. -Patient advised to keep a CBG log and bring it into follow-up with his primary care physician in one week. -Also advised to allow time for meals and snacks during work time.  Hypertension  -Well controlled.  Procedures:  None   Consultations:  Cardiology, Dr. Harl Bowie  Discharge Instructions  Discharge Instructions    Diet Carb Modified    Complete by:  As directed      Increase activity slowly    Complete by:  As directed             Medication List    STOP taking these medications        INVOKANA 300 MG Tabs tablet  Generic drug:  canagliflozin     VICTOZA [place]      TAKE these medications        allopurinol 300 MG tablet  Commonly known as:  ZYLOPRIM  Take 300 mg by mouth daily.     aspirin EC 81 MG tablet  Take 81 mg by mouth daily.     atorvastatin 40 MG tablet  Commonly known as:  LIPITOR  Take 1 tablet (40 mg total) by mouth daily at 6 PM.     ESTER-C 1000-50 MG Tabs  Take 1 tablet by mouth daily.     HYDROcodone-acetaminophen 7.5-325 MG per tablet  Commonly known as:  NORCO  Take 0.5-1 tablets by mouth every  6 (six) hours as needed for moderate pain.     lisinopril 10 MG tablet  Commonly known as:  PRINIVIL,ZESTRIL  Take 10 mg by mouth daily.     meloxicam 15 MG tablet  Commonly known as:  MOBIC  Take 15 mg by mouth daily.     methocarbamol 750 MG tablet  Commonly known as:  ROBAXIN  Take 750 mg by mouth 4 (four) times daily as needed for muscle spasms.     multivitamin with minerals Tabs tablet  Take 1 tablet by mouth daily.     pioglitazone-metformin 15-500 MG per tablet  Commonly known as:  ACTOPLUS MET  Take 1 tablet by mouth  2 (two) times daily with a meal.     pregabalin 150 MG capsule  Commonly known as:  LYRICA  Take 150 mg by mouth daily.     tiZANidine 4 MG tablet  Commonly known as:  ZANAFLEX  Take 4 mg by mouth every 6 (six) hours as needed for muscle spasms.       Allergies  Allergen Reactions  . Penicillins Anaphylaxis, Hives and Swelling       Follow-up Information    Follow up with Carlyle Dolly, F, MD. Schedule an appointment as soon as possible for a visit in 2 weeks.   Specialty:  Cardiology   Contact information:   [address] [place] [address] [phone]       Follow up with VYAS,DHRUV B., MD. Schedule an appointment as soon as possible for a visit in 1 week.   Specialty:  Internal Medicine   Contact information:   [place] [address] [phone]        The results of significant diagnostics from this hospitalization (including imaging, microbiology, ancillary and laboratory) are listed below for reference.    Significant Diagnostic Studies: Dg Chest 2 View  05/05/2014   CLINICAL DATA:  Mid chest pain radiating down the left arm, tingling, numbness in both hands, shortness of breath for 2 days  EXAM: CHEST  2 VIEW  COMPARISON:  None.  FINDINGS: The heart size and mediastinal contours are within normal limits. Both lungs are clear. The visualized skeletal structures are unremarkable.  IMPRESSION: No active cardiopulmonary disease.   Electronically Signed   By: Kathreen Devoid   On: 05/05/2014 17:51    Microbiology: No results found for this or any previous visit (from the past 240 hour(s)).   Labs: Basic Metabolic Panel:  Recent Labs Lab 05/05/14 1900 05/05/14 2120 05/06/14 0347 05/06/14 0858  NA 140  --  139  --   K 4.2  --  4.2  --   CL 100  --  101  --   CO2 27  --  25  --   GLUCOSE 101*  --  120*  --   BUN 15  --  18  --   CREATININE 0.78 0.92 0.86  --   CALCIUM 9.9  --  9.2  --   MG  --   --   --  2.3   Liver Function  Tests:  Recent Labs Lab 05/06/14 1159  AST 17  ALT 25  ALKPHOS 70  BILITOT 0.4  PROT 7.1  ALBUMIN 3.9   No results for input(s): LIPASE, AMYLASE in the last 168 hours. No results for input(s): AMMONIA in the last 168 hours. CBC:  Recent Labs Lab 05/05/14 1900 05/05/14 2120 05/06/14 0347  WBC 7.7 8.4 7.9  HGB 15.3  15.6 14.5  HCT 45.3 45.4 44.4  MCV 88.8 89.2 89.5  PLT 298 319 293   Cardiac Enzymes:  Recent Labs Lab 05/05/14 1900 05/05/14 2120 05/06/14 0347 05/06/14 0858  TROPONINI <0.30 <0.30 <0.30 <0.30   BNP: BNP (last 3 results) No results for input(s): PROBNP in the last 8760 hours. CBG:  Recent Labs Lab 05/05/14 2129 05/06/14 0801 05/06/14 1144  GLUCAP 138* 117* 133*       Signed:  HERNANDEZ ACOSTA,ESTELA  [facility] Pager: [phone] 05/06/2014, 2:40 PM

## 2014-05-06 NOTE — Telephone Encounter (Signed)
-----   Message from Lestine Mount sent at 05/06/2014 12:46 PM EST ----- Regarding: need orders please Needs orders for stress cardiolite and echo Dx Cp by Jory Sims. Thanks

## 2014-05-06 NOTE — Care Management Note (Signed)
    Page 1 of 1   05/06/2014     10:28:15 AM CARE MANAGEMENT NOTE 05/06/2014  Patient:  Samuel Caldwell, Samuel Caldwell   Account Number:  [id number]  Date Initiated:  05/06/2014  Documentation initiated by:  Theophilus Kinds  Subjective/Objective Assessment:   Pt admitted from home with CP. Pt lives with his wife and will return home at discharge. Pt is independent with ADL's.     Action/Plan:   No CM needs noted.   Anticipated DC Date:  05/07/2014   Anticipated DC Plan:  [place]  CM consult      Choice offered to / List presented to:             Status of service:  Completed, signed off Medicare Important Message given?   (If response is "NO", the following Medicare IM given date fields will be blank) Date Medicare IM given:   Medicare IM given by:   Date Additional Medicare IM given:   Additional Medicare IM given by:    Discharge Disposition:  HOME/SELF CARE  Per UR Regulation:    If discussed at Long Length of Stay Meetings, dates discussed:    Comments:  05/06/14 H. [place], RN BSN CM

## 2014-05-06 NOTE — Plan of Care (Signed)
Problem: Phase I Progression Outcomes Goal: Voiding-avoid urinary catheter unless indicated Outcome: Completed/Met Date Met:  05/06/14

## 2014-05-06 NOTE — Progress Notes (Signed)
Discharge instructions gone over with patient and wife, including new prescriptions, diet information, and appointments.  Pt IV removed, tolerated well. Removed telemetry.  NT is taking pt down to lobby for wife to pick up.

## 2014-05-10 ENCOUNTER — Other Ambulatory Visit: Payer: Self-pay | Admitting: *Deleted

## 2014-05-10 ENCOUNTER — Encounter (HOSPITAL_COMMUNITY)
Admission: RE | Admit: 2014-05-10 | Discharge: 2014-05-10 | Disposition: A | Discharge status: Home / Self Care | Payer: 59 | Source: Ambulatory Visit | Attending: Adult Health | Admitting: Adult Health

## 2014-05-10 ENCOUNTER — Encounter (HOSPITAL_COMMUNITY): Payer: Self-pay

## 2014-05-10 ENCOUNTER — Ambulatory Visit (HOSPITAL_COMMUNITY)
Admit: 2014-05-10 | Discharge: 2014-05-10 | Disposition: A | Discharge status: Home / Self Care | Payer: PRIVATE HEALTH INSURANCE | Source: Ambulatory Visit | Attending: Adult Health | Admitting: Adult Health

## 2014-05-10 ENCOUNTER — Other Ambulatory Visit: Payer: Self-pay

## 2014-05-10 ENCOUNTER — Telehealth: Payer: Self-pay | Admitting: *Deleted

## 2014-05-10 ENCOUNTER — Other Ambulatory Visit (HOSPITAL_COMMUNITY): Payer: Self-pay

## 2014-05-10 DIAGNOSIS — E119 Type 2 diabetes mellitus without complications: Secondary | ICD-10-CM | POA: Diagnosis not present

## 2014-05-10 DIAGNOSIS — I209 Angina pectoris, unspecified: Secondary | ICD-10-CM | POA: Diagnosis not present

## 2014-05-10 DIAGNOSIS — R079 Chest pain, unspecified: Secondary | ICD-10-CM | POA: Insufficient documentation

## 2014-05-10 DIAGNOSIS — I517 Cardiomegaly: Secondary | ICD-10-CM

## 2014-05-10 DIAGNOSIS — I10 Essential (primary) hypertension: Secondary | ICD-10-CM | POA: Diagnosis not present

## 2014-05-10 DIAGNOSIS — Z87891 Personal history of nicotine dependence: Secondary | ICD-10-CM | POA: Diagnosis not present

## 2014-05-10 DIAGNOSIS — E162 Hypoglycemia, unspecified: Secondary | ICD-10-CM | POA: Insufficient documentation

## 2014-05-10 IMAGING — NM NM MYOCAR MULTI W/SPECT W/WALL MOTION & EF
2 series · 12 of 12 positions shown · non-contrast
Comparison: None.

CLINICAL DATA: 53-year-old male with no known history of coronary
artery disease referred for chest pain.

EXAM:
MYOCARDIAL IMAGING WITH SPECT (REST AND EXERCISE)
GATED LEFT VENTRICULAR WALL MOTION STUDY
LEFT VENTRICULAR EJECTION FRACTION
TECHNIQUE: Standard myocardial SPECT imaging was performed after resting
intravenous injection of 10 mCi [QU] sestamibi. Subsequently,
exercise tolerance test was performed by the patient under the
supervision of the Cardiology staff. At peak-stress, 30 mCi [QU]
sestamibi was injected intravenously and standard myocardial SPECT
imaging was performed. Quantitative gated imaging was also performed
to evaluate left ventricular wall motion, and estimate left
ventricular ejection fraction.

[Series 1: rest · 8.28mm/px · 6 of 64 frames shown]
[frame 6/64]
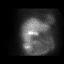
[frame 16/64]
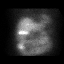
[frame 27/64]
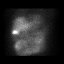
[frame 38/64]
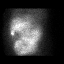
[frame 48/64]
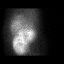
[frame 59/64]
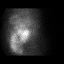

[Series 2: stress gated · 8.28mm/px · 6 of 64 frames shown]
[frame 6/64]
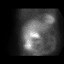
[frame 16/64]
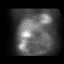
[frame 27/64]
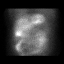
[frame 38/64]
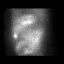
[frame 48/64]
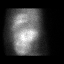
[frame 59/64]
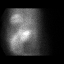

[12 of 12 positions shown; findings below may reference images not displayed]

FINDINGS: Exercise stress

Baseline EKG shows normal sinus rhythm. The patient was exercised
according to the Bruce protocol for 8 min and 2 seconds achieving a
work level of 10.4 Mets. The resting heart rate is 61 beats per min
rose to a maximal rate of 160 beats per min, representing 94% of the
maximal age predicted heart rate. The resting blood pressure of
132/77 increased to a maximum of 159/75. The test was stopped due to
fatigue, the patient did not experience any chest pain. Stress EKG
interpretation limited by heavy artifact. There are no clear
specific ischemic changes and no evidence of significant
arrhythmias.

Perfusion: There is a small moderate intensity inferoseptal wall
defect seen in the resting images. This same defect is seen in the
post stress images however is less intense. The inferoseptal wall
has normal wall motion. Findings most consistent with sub-
diaphragmatic attenuation. There is a small mild intensity apical
defect seen in the resting images, this defect is less intense in
the post stress images. The apex has normal wall motion. Findings
most consistent with apical thinning.

Wall Motion: Normal left ventricular wall motion. No left
ventricular dilation.

Left Ventricular Ejection Fraction: 61 %

End diastolic volume 118 ml

End systolic volume 46 ml
IMPRESSION: 1. No reversible ischemia or infarction.

2. Normal left ventricular wall motion.

3. Left ventricular ejection fraction 61%

4. Low-risk stress test findings*.

5. Stress EKG interpretation limited by heavy EKG artifact, no clear
ischemia. Duke treadmill score of 8 consistent with low risk for
major cardiac events.

*[QU] Appropriate Use Criteria for Coronary Revascularization
Focused Update: J Am Coll Cardiol. [QU];59(9):857-881.
[URL]

## 2014-05-10 MED ORDER — TECHNETIUM TC 99M SESTAMIBI - CARDIOLITE
10.0000 | Freq: Once | INTRAVENOUS | Status: AC | PRN
  Administered 2014-05-10: 10 via INTRAVENOUS

## 2014-05-10 MED ORDER — TECHNETIUM TC 99M SESTAMIBI GENERIC - CARDIOLITE
30.0000 | Freq: Once | INTRAVENOUS | Status: AC | PRN
  Administered 2014-05-10: 30 via INTRAVENOUS

## 2014-05-10 NOTE — Telephone Encounter (Signed)
Pt wife made aware, forwarded results to Dr. Woody Seller

## 2014-05-10 NOTE — Progress Notes (Signed)
Stress Lab Nurses Notes - Samuel Caldwell  Samuel Caldwell 05/10/2014 Reason for doing test: Chest Pain Type of test: Stress Cardiolite Nurse performing test: Felicity Coyer, RN Nuclear Medicine Tech: Melburn Hake Echo Tech: Not Applicable MD performing test: Dr. Lynann Beaver. Samuel Nails, NP Family MD: Dr. Woody Seller Test explained and consent signed: Yes.   IV started: Saline lock flushed, No redness or edema and Saline lock started in radiology Symptoms:  Reports left sided cp prior to test, denies worsening pain during test.  Denies any other symptoms. Treatment/Intervention: None Reason test stopped: protocol completed After recovery IV was: Discontinued via X-ray tech Patient to return to [place]. Med at : 1230 Patient discharged: Home Patient's Condition upon discharge was: stable Comments: During test BP 151/69 & HR 148.  In recovery BP 118/66 & HR 89. Samuel Caldwell

## 2014-05-10 NOTE — Telephone Encounter (Signed)
-----   Message from Lendon Colonel, NP sent at 05/10/2014  3:59 PM EST ----- Stress test was negative for ischemia. Normal results. No changes in his medication. Reassurance to be provided.

## 2014-05-10 NOTE — Progress Notes (Signed)
*  PRELIMINARY RESULTS* Echocardiogram 2D Echocardiogram has been performed.  Leavy Cella 05/10/2014, 9:08 AM

## 2014-05-17 ENCOUNTER — Encounter: Payer: Self-pay | Admitting: Adult Health

## 2014-05-28 ENCOUNTER — Encounter: Payer: Self-pay | Admitting: *Deleted

## 2016-08-18 ENCOUNTER — Ambulatory Visit (INDEPENDENT_AMBULATORY_CARE_PROVIDER_SITE_OTHER): Payer: BLUE CROSS/BLUE SHIELD | Admitting: Urology

## 2016-08-18 ENCOUNTER — Ambulatory Visit: Payer: Self-pay | Admitting: Urology

## 2016-08-18 ENCOUNTER — Other Ambulatory Visit (HOSPITAL_COMMUNITY)
Admission: RE | Admit: 2016-08-18 | Discharge: 2016-08-18 | Disposition: A | Discharge status: Home / Self Care | Payer: BLUE CROSS/BLUE SHIELD | Source: Ambulatory Visit | Attending: Urology | Admitting: Urology

## 2016-08-18 DIAGNOSIS — R310 Gross hematuria: Secondary | ICD-10-CM | POA: Diagnosis present

## 2016-08-18 DIAGNOSIS — R351 Nocturia: Secondary | ICD-10-CM | POA: Diagnosis not present

## 2016-08-24 ENCOUNTER — Other Ambulatory Visit: Payer: Self-pay | Admitting: Urology

## 2016-08-24 DIAGNOSIS — R310 Gross hematuria: Secondary | ICD-10-CM

## 2016-09-06 ENCOUNTER — Ambulatory Visit (HOSPITAL_COMMUNITY)
Admission: RE | Admit: 2016-09-06 | Discharge: 2016-09-06 | Disposition: A | Discharge status: Home / Self Care | Payer: BLUE CROSS/BLUE SHIELD | Source: Ambulatory Visit | Attending: Urology | Admitting: Urology

## 2016-09-06 DIAGNOSIS — I700 Atherosclerosis of aorta: Secondary | ICD-10-CM | POA: Diagnosis not present

## 2016-09-06 DIAGNOSIS — K5730 Diverticulosis of large intestine without perforation or abscess without bleeding: Secondary | ICD-10-CM | POA: Diagnosis not present

## 2016-09-06 DIAGNOSIS — N3289 Other specified disorders of bladder: Secondary | ICD-10-CM | POA: Diagnosis not present

## 2016-09-06 DIAGNOSIS — R310 Gross hematuria: Secondary | ICD-10-CM

## 2016-09-06 MED ORDER — IOPAMIDOL (ISOVUE-300) INJECTION 61%
150.0000 mL | Freq: Once | INTRAVENOUS | Status: AC | PRN
  Administered 2016-09-06: 125 mL via INTRAVENOUS

## 2016-09-15 ENCOUNTER — Ambulatory Visit (INDEPENDENT_AMBULATORY_CARE_PROVIDER_SITE_OTHER): Payer: BLUE CROSS/BLUE SHIELD | Admitting: Urology

## 2016-09-15 DIAGNOSIS — R310 Gross hematuria: Secondary | ICD-10-CM | POA: Diagnosis not present

## 2016-09-17 ENCOUNTER — Other Ambulatory Visit: Payer: Self-pay | Admitting: Urology

## 2016-09-20 ENCOUNTER — Other Ambulatory Visit: Payer: Self-pay | Admitting: Urology

## 2016-10-22 NOTE — Patient Instructions (Signed)
Samuel Caldwell  10/22/2016     @PREFPERIOPPHARMACY @   Your procedure is scheduled on  11/01/2016   Report to [facility] at  720  A.M.  Call this number if you have problems the morning of surgery:  [phone]   Remember:  Do not eat food or drink liquids after midnight.  Take these medicines the morning of surgery with A SIP OF WATER  Allopurinol, nexium, mobic (or robaxin or zanaflex) if needed, lyrica.   Do not wear jewelry, make-up or nail polish.  Do not wear lotions, powders, or perfumes, or deoderant.  Do not shave 48 hours prior to surgery.  Men may shave face and neck.  Do not bring valuables to the hospital.  [facility] is not responsible for any belongings or valuables.  Contacts, dentures or bridgework may not be worn into surgery.  Leave your suitcase in the car.  After surgery it may be brought to your room.  For patients admitted to the hospital, discharge time will be determined by your treatment team.  Patients discharged the day of surgery will not be allowed to drive home.   Name and phone number of your driver:   family Special instructions: None  Please read over the following fact sheets that you were given. Anesthesia Post-op Instructions and Care and Recovery After Surgery       Transurethral Resection of Bladder Tumor Transurethral resection of a bladder tumor is the removal (resection) of a cancerous growth (tumor) on the inside wall of the bladder. The bladder is the organ that holds urine. The tumor is removed through the tube that carries urine out of the body (urethra). In a transurethral resection, a thin telescope with a light, a tiny camera, and an electric cutting edge (resectoscope) is passed through the urethra. In men, the opening of the urethra is at the end of the penis. In women, it is just above the vaginal opening. Tell a health care provider about:  Any allergies you have.  All medicines you are taking,  including vitamins, herbs, eye drops, creams, and over-the-counter medicines.  Any problems you or family members have had with anesthetic medicines.  Any blood disorders you have.  Any surgeries you have had.  Any medical conditions you have.  Any recent urinary tract infections you have had.  Whether you are pregnant or may be pregnant. What are the risks? Generally, this is a safe procedure. However, problems may occur, including:  Infection.  Bleeding.  Allergic reactions to medicines.  Damage to other structures or organs, such as:  The urethra.  The tubes that drain urine from the kidneys into the bladder (ureters).  Pain and burning during urination.  Difficulty urinating due to partial blockage of the urethra.  Inability to urinate (urinary retention). What happens before the procedure?  Follow instructions from your health care provider about eating and drinking restrictions.  Ask your health care provider about:  Changing or stopping your regular medicines. This is especially important if you are taking diabetes medicines or blood thinners.  Taking medicines such as aspirin and ibuprofen. These medicines can thin your blood. Do not take these medicines before your procedure if your health care provider instructs you not to.  You may have a physical exam.  You may have tests, including:  Blood tests.  Urine tests.  Electrocardiogram (ECG). This test measures the electrical activity of the heart.  You may be given antibiotic medicine to help prevent infection.  Ask your health care provider how your surgical site will be marked or identified.  Plan to have someone take you home after the procedure. What happens during the procedure?  To reduce your risk of infection:  Your health care team will wash or sanitize their hands.  Your skin will be washed with soap.  An IV tube will be inserted into one of your veins.  You will be given one or  more of the following:  A medicine to help you relax (sedative).  A medicine to make you fall asleep (general anesthetic).  A medicine that is injected into your spine to numb the area below and slightly above the injection site (spinal anesthetic).  Your legs will be placed in foot rests (stirrups) so that your legs are apart and your knees are bent.  The resectoscope will be passed through your urethra and into your bladder.  The part of your bladder that is affected by the tumor will be resected using the cutting edge of the resectoscope.  The resectoscope will be removed.  A thin, flexible tube (catheter) will be passed through your urethra and into your bladder. The catheter will drain urine into a bag outside of your body.  Fluid may be passed through the catheter to keep the catheter open. The procedure may vary among health care providers and hospitals. What happens after the procedure?  Your blood pressure, heart rate, breathing rate, and blood oxygen level will be monitored often until the medicines you were given have worn off.  You may continue to receive fluids and medicines through an IV tube.  You will have some pain. Pain medicine will be available to help you.  You will have a catheter draining your urine.  You will have blood in your urine. Your catheter may be kept in until your urine is clear.  Your urinary drainage will be monitored. If necessary, your bladder may be rinsed out (irrigated) by passing fluid through your catheter.  You will be encouraged to walk around as soon as possible.  You may have to wear compression stockings. These stockings help to prevent blood clots and reduce swelling in your legs.  Do not drive for 24 hours if you received a sedative. This information is not intended to replace advice given to you by your health care provider. Make sure you discuss any questions you have with your health care provider. Document Released:  03/13/2009 Document Revised: 01/18/2016 Document Reviewed: 02/06/2015 Elsevier Interactive Patient Education  2017 [employer]  Transurethral Resection of Bladder Tumor, Care After Refer to this sheet in the next few weeks. These instructions provide you with information about caring for yourself after your procedure. Your health care provider may also give you more specific instructions. Your treatment has been planned according to current medical practices, but problems sometimes occur. Call your health care provider if you have any problems or questions after your procedure. What can I expect after the procedure? After the procedure, it is common to have:  A small amount of blood in your urine for up to 2 weeks.  Soreness or mild discomfort from your catheter. After your catheter is removed, you may have mild soreness, especially when urinating.  Pain in your lower abdomen. Follow these instructions at home:   Medicines   Take over-the-counter and prescription medicines only as told by your health care provider.  Do not drive or operate heavy machinery  while taking prescription pain medicine.  Do not drive for 24 hours if you received a sedative.  If you were prescribed antibiotic medicine, take it as told by your health care provider. Do not stop taking the antibiotic even if you start to feel better. Activity   Return to your normal activities as told by your health care provider. Ask your health care provider what activities are safe for you.  Do not lift anything that is heavier than 10 lb (4.5 kg) for as long as told by your health care provider.  Avoid intense physical activity for as long as told by your health care provider.  Walk at least one time every day. This helps to prevent blood clots. You may increase your physical activity gradually as you start to feel better. General instructions   Do not drink alcohol for as long as told by your health care provider. This  is especially important if you are taking prescription pain medicines.  Do not take baths, swim, or use a hot tub until your health care provider approves.  If you have a catheter, follow instructions from your health care provider about caring for your catheter and your drainage bag.  Drink enough fluid to keep your urine clear or pale yellow.  Wear compression stockings as told by your health care provider. These stockings help to prevent blood clots and reduce swelling in your legs.  Keep all follow-up visits as told by your health care provider. This is important. Contact a health care provider if:  You have pain that gets worse or does not improve with medicine.  You have blood in your urine for more than 2 weeks.  You have cloudy or bad-smelling urine.  You become constipated. Signs of constipation may include having:  Fewer than three bowel movements in a week.  Difficulty having a bowel movement.  Stools that are dry, hard, or larger than normal.  You have a fever. Get help right away if:  You have:  Severe pain.  Bright-red blood in your urine.  Blood clots in your urine.  A lot of blood in your urine.  Your catheter has been removed and you are not able to urinate.  You have a catheter in place and the catheter is not draining urine. This information is not intended to replace advice given to you by your health care provider. Make sure you discuss any questions you have with your health care provider. Document Released: 04/28/2015 Document Revised: 01/18/2016 Document Reviewed: 02/06/2015 Elsevier Interactive Patient Education  2017 [place].  Cystoscopy Cystoscopy is a procedure that is used to help diagnose and sometimes treat conditions that affect that lower urinary tract. The lower urinary tract includes the bladder and the tube that drains urine from the bladder out of the body (urethra). Cystoscopy is performed with a thin, tube-shaped instrument  with a light and camera at the end (cystoscope). The cystoscope may be hard (rigid) or flexible, depending on the goal of the procedure.The cystoscope is inserted through the urethra, into the bladder. Cystoscopy may be recommended if you have:  Urinary tractinfections that keep coming back (recurring).  Blood in the urine (hematuria).  Loss of bladder control (urinary incontinence) or an overactive bladder.  Unusual cells found in a urine sample.  A blockage in the urethra.  Painful urination.  An abnormality in the bladder found during an intravenous pyelogram (IVP) or CT scan. Cystoscopy may also be done to remove a sample of tissue to  be examined under a microscope (biopsy). Tell a health care provider about:  Any allergies you have.  All medicines you are taking, including vitamins, herbs, eye drops, creams, and over-the-counter medicines.  Any problems you or family members have had with anesthetic medicines.  Any blood disorders you have.  Any surgeries you have had.  Any medical conditions you have.  Whether you are pregnant or may be pregnant. What are the risks? Generally, this is a safe procedure. However, problems may occur, including:  Infection.  Bleeding.  Allergic reactions to medicines.  Damage to other structures or organs. What happens before the procedure?  Ask your health care provider about:  Changing or stopping your regular medicines. This is especially important if you are taking diabetes medicines or blood thinners.  Taking medicines such as aspirin and ibuprofen. These medicines can thin your blood. Do not take these medicines before your procedure if your health care provider instructs you not to.  Follow instructions from your health care provider about eating or drinking restrictions.  You may be given antibiotic medicine to help prevent infection.  You may have an exam or testing, such as X-rays of the bladder, urethra, or  kidneys.  You may have urine tests to check for signs of infection.  Plan to have someone take you home after the procedure. What happens during the procedure?  To reduce your risk of infection,your health care team will wash or sanitize their hands.  You will be given one or more of the following:  A medicine to help you relax (sedative).  A medicine to numb the area (local anesthetic).  The area around the opening of your urethra will be cleaned.  The cystoscope will be passed through your urethra into your bladder.  Germ-free (sterile)fluid will flow through the cystoscope to fill your bladder. The fluid will stretch your bladder so that your surgeon can clearly examine your bladder walls.  The cystoscope will be removed and your bladder will be emptied. The procedure may vary among health care providers and hospitals. What happens after the procedure?  You may have some soreness or pain in your abdomen and urethra. Medicines will be available to help you.  You may have some blood in your urine.  Do not drive for 24 hours if you received a sedative. This information is not intended to replace advice given to you by your health care provider. Make sure you discuss any questions you have with your health care provider. Document Released: 05/14/2000 Document Revised: 09/25/2015 Document Reviewed: 04/03/2015 Elsevier Interactive Patient Education  2017 [place].  Cystoscopy, Care After Refer to this sheet in the next few weeks. These instructions provide you with information about caring for yourself after your procedure. Your health care provider may also give you more specific instructions. Your treatment has been planned according to current medical practices, but problems sometimes occur. Call your health care provider if you have any problems or questions after your procedure. What can I expect after the procedure? After the procedure, it is common to have:  Mild  pain when you urinate. Pain should stop within a few minutes after you urinate. This may last for up to 1 week.  A small amount of blood in your urine for several days.  Feeling like you need to urinate but producing only a small amount of urine. Follow these instructions at home:   Medicines   Take over-the-counter and prescription medicines only as told by your health care  provider.  If you were prescribed an antibiotic medicine, take it as told by your health care provider. Do not stop taking the antibiotic even if you start to feel better. General instructions    Return to your normal activities as told by your health care provider. Ask your health care provider what activities are safe for you.  Do not drive for 24 hours if you received a sedative.  Watch for any blood in your urine. If the amount of blood in your urine increases, call your health care provider.  Follow instructions from your health care provider about eating or drinking restrictions.  If a tissue sample was removed for testing (biopsy) during your procedure, it is your responsibility to get your test results. Ask your health care provider or the department performing the test when your results will be ready.  Drink enough fluid to keep your urine clear or pale yellow.  Keep all follow-up visits as told by your health care provider. This is important. Contact a health care provider if:  You have pain that gets worse or does not get better with medicine, especially pain when you urinate.  You have difficulty urinating. Get help right away if:  You have more blood in your urine.  You have blood clots in your urine.  You have abdominal pain.  You have a fever or chills.  You are unable to urinate. This information is not intended to replace advice given to you by your health care provider. Make sure you discuss any questions you have with your health care provider. Document Released: 12/04/2004 Document  Revised: 10/23/2015 Document Reviewed: 04/03/2015 Elsevier Interactive Patient Education  2017 [place] Anesthesia, Adult General anesthesia is the use of medicines to make a person "go to sleep" (be unconscious) for a medical procedure. General anesthesia is often recommended when a procedure:  Is long.  Requires you to be still or in an unusual position.  Is major and can cause you to lose blood.  Is impossible to do without general anesthesia. The medicines used for general anesthesia are called general anesthetics. In addition to making you sleep, the medicines:  Prevent pain.  Control your blood pressure.  Relax your muscles. Tell a health care provider about:  Any allergies you have.  All medicines you are taking, including vitamins, herbs, eye drops, creams, and over-the-counter medicines.  Any problems you or family members have had with anesthetic medicines.  Types of anesthetics you have had in the past.  Any bleeding disorders you have.  Any surgeries you have had.  Any medical conditions you have.  Any history of heart or lung conditions, such as heart failure, sleep apnea, or chronic obstructive pulmonary disease (COPD).  Whether you are pregnant or may be pregnant.  Whether you use tobacco, alcohol, marijuana, or street drugs.  Any history of Armed forces logistics/support/administrative officer.  Any history of depression or anxiety. What are the risks? Generally, this is a safe procedure. However, problems may occur, including:  Allergic reaction to anesthetics.  Lung and heart problems.  Inhaling food or liquids from your stomach into your lungs (aspiration).  Injury to nerves.  Waking up during your procedure and being unable to move (rare).  Extreme agitation or a state of mental confusion (delirium) when you wake up from the anesthetic.  Air in the bloodstream, which can lead to stroke. These problems are more likely to develop if you are having a major  surgery or if you have an  advanced medical condition. You can prevent some of these complications by answering all of your health care provider's questions thoroughly and by following all pre-procedure instructions. General anesthesia can cause side effects, including:  Nausea or vomiting  A sore throat from the breathing tube.  Feeling cold or shivery.  Feeling tired, washed out, or achy.  Sleepiness or drowsiness.  Confusion or agitation. What happens before the procedure? Staying hydrated  Follow instructions from your health care provider about hydration, which may include:  Up to 2 hours before the procedure - you may continue to drink clear liquids, such as water, clear fruit juice, black coffee, and plain tea. Eating and drinking restrictions  Follow instructions from your health care provider about eating and drinking, which may include:  8 hours before the procedure - stop eating heavy meals or foods such as meat, fried foods, or fatty foods.  6 hours before the procedure - stop eating light meals or foods, such as toast or cereal.  6 hours before the procedure - stop drinking milk or drinks that contain milk.  2 hours before the procedure - stop drinking clear liquids. Medicines   Ask your health care provider about:  Changing or stopping your regular medicines. This is especially important if you are taking diabetes medicines or blood thinners.  Taking medicines such as aspirin and ibuprofen. These medicines can thin your blood. Do not take these medicines before your procedure if your health care provider instructs you not to.  Taking new dietary supplements or medicines. Do not take these during the week before your procedure unless your health care provider approves them.  If you are told to take a medicine or to continue taking a medicine on the day of the procedure, take the medicine with sips of water. General instructions    Ask if you will be going home  the same day, the following day, or after a longer hospital stay.  Plan to have someone take you home.  Plan to have someone stay with you for the first 24 hours after you leave the hospital or clinic.  For 3-6 weeks before the procedure, try not to use any tobacco products, such as cigarettes, chewing tobacco, and e-cigarettes.  You may brush your teeth on the morning of the procedure, but make sure to spit out the toothpaste. What happens during the procedure?  You will be given anesthetics through a mask and through an IV tube in one of your veins.  You may receive medicine to help you relax (sedative).  As soon as you are asleep, a breathing tube may be used to help you breathe.  An anesthesia specialist will stay with you throughout the procedure. He or she will help keep you comfortable and safe by continuing to give you medicines and adjusting the amount of medicine that you get. He or she will also watch your blood pressure, pulse, and oxygen levels to make sure that the anesthetics do not cause any problems.  If a breathing tube was used to help you breathe, it will be removed before you wake up. The procedure may vary among health care providers and hospitals. What happens after the procedure?  You will wake up, often slowly, after the procedure is complete, usually in a recovery area.  Your blood pressure, heart rate, breathing rate, and blood oxygen level will be monitored until the medicines you were given have worn off.  You may be given medicine to help you calm down if  you feel anxious or agitated.  If you will be going home the same day, your health care provider may check to make sure you can stand, drink, and urinate.  Your health care providers will treat your pain and side effects before you go home.  Do not drive for 24 hours if you received a sedative.  You may:  Feel nauseous and vomit.  Have a sore throat.  Have mental slowness.  Feel cold or  shivery.  Feel sleepy.  Feel tired.  Feel sore or achy, even in parts of your body where you did not have surgery. This information is not intended to replace advice given to you by your health care provider. Make sure you discuss any questions you have with your health care provider. Document Released: 08/24/2007 Document Revised: 10/28/2015 Document Reviewed: 05/01/2015 Elsevier Interactive Patient Education  2017 [place] Anesthesia, Adult, Care After These instructions provide you with information about caring for yourself after your procedure. Your health care provider may also give you more specific instructions. Your treatment has been planned according to current medical practices, but problems sometimes occur. Call your health care provider if you have any problems or questions after your procedure. What can I expect after the procedure? After the procedure, it is common to have:  Vomiting.  A sore throat.  Mental slowness. It is common to feel:  Nauseous.  Cold or shivery.  Sleepy.  Tired.  Sore or achy, even in parts of your body where you did not have surgery. Follow these instructions at home: For at least 24 hours after the procedure:   Do not:  Participate in activities where you could fall or become injured.  Drive.  Use heavy machinery.  Drink alcohol.  Take sleeping pills or medicines that cause drowsiness.  Make important decisions or sign legal documents.  Take care of children on your own.  Rest. Eating and drinking   If you vomit, drink water, juice, or soup when you can drink without vomiting.  Drink enough fluid to keep your urine clear or pale yellow.  Make sure you have little or no nausea before eating solid foods.  Follow the diet recommended by your health care provider. General instructions   Have a responsible adult stay with you until you are awake and alert.  Return to your normal activities as told by your  health care provider. Ask your health care provider what activities are safe for you.  Take over-the-counter and prescription medicines only as told by your health care provider.  If you smoke, do not smoke without supervision.  Keep all follow-up visits as told by your health care provider. This is important. Contact a health care provider if:  You continue to have nausea or vomiting at home, and medicines are not helpful.  You cannot drink fluids or start eating again.  You cannot urinate after 8-12 hours.  You develop a skin rash.  You have fever.  You have increasing redness at the site of your procedure. Get help right away if:  You have difficulty breathing.  You have chest pain.  You have unexpected bleeding.  You feel that you are having a life-threatening or urgent problem. This information is not intended to replace advice given to you by your health care provider. Make sure you discuss any questions you have with your health care provider. Document Released: 08/23/2000 Document Revised: 10/20/2015 Document Reviewed: 05/01/2015 Elsevier Interactive Patient Education  2017 [employer].

## 2016-10-27 ENCOUNTER — Encounter (HOSPITAL_COMMUNITY): Payer: Self-pay

## 2016-10-27 ENCOUNTER — Encounter (HOSPITAL_COMMUNITY)
Admission: RE | Admit: 2016-10-27 | Discharge: 2016-10-27 | Disposition: A | Discharge status: Home / Self Care | Payer: BLUE CROSS/BLUE SHIELD | Source: Ambulatory Visit | Attending: Urology | Admitting: Urology

## 2016-10-27 DIAGNOSIS — Z01810 Encounter for preprocedural cardiovascular examination: Secondary | ICD-10-CM | POA: Insufficient documentation

## 2016-10-27 DIAGNOSIS — Z01812 Encounter for preprocedural laboratory examination: Secondary | ICD-10-CM | POA: Diagnosis not present

## 2016-10-27 DIAGNOSIS — Z01818 Encounter for other preprocedural examination: Secondary | ICD-10-CM | POA: Diagnosis present

## 2016-10-27 HISTORY — DX: Personal history of urinary calculi: Z87.442

## 2016-10-27 HISTORY — DX: Gastro-esophageal reflux disease without esophagitis: K21.9

## 2016-10-27 LAB — BASIC METABOLIC PANEL
ANION GAP: 8 (ref 5–15)
BUN: 16 mg/dL (ref 6–20)
CO2: 28 mmol/L (ref 22–32)
Calcium: 9.7 mg/dL (ref 8.9–10.3)
Chloride: 104 mmol/L (ref 101–111)
Creatinine, Ser: 0.84 mg/dL (ref 0.61–1.24)
GFR calc Af Amer: >60 mL/min (ref >60)
GFR calc non Af Amer: >60 mL/min (ref >60)
GLUCOSE: 117 mg/dL — AB (ref 65–99)
POTASSIUM: 4.2 mmol/L (ref 3.5–5.1)
Sodium: 140 mmol/L (ref 135–145)

## 2016-10-27 LAB — CBC
HEMATOCRIT: 40.8 % (ref 39.0–52.0)
Hemoglobin: 13.4 g/dL (ref 13.0–17.0)
MCH: 29.5 pg (ref 26.0–34.0)
MCHC: 32.8 g/dL (ref 30.0–36.0)
MCV: 89.9 fL (ref 78.0–100.0)
Platelets: 259 10*3/uL (ref 150–400)
RBC: 4.54 MIL/uL (ref 4.22–5.81)
RDW: 13.6 % (ref 11.5–15.5)
WBC: 7.2 10*3/uL (ref 4.0–10.5)

## 2016-10-27 NOTE — Progress Notes (Signed)
   10/27/16 1017  OBSTRUCTIVE SLEEP APNEA  Have you ever been diagnosed with sleep apnea through a sleep study? No  Do you snore loudly (loud enough to be heard through closed doors)?  1  Do you often feel tired, fatigued, or sleepy during the daytime (such as falling asleep during driving or talking to someone)? 1  Has anyone observed you stop breathing during your sleep? 1  Do you have, or are you being treated for high blood pressure? 1  BMI more than 35 kg/m2? 1  Age > 50 (1-yes) 1  Neck circumference greater than:Male 16 inches or larger, Male 17inches or larger? 1  Male Gender (Yes=1) 1  Obstructive Sleep Apnea Score 8  Score 5 or greater  Results sent to PCP

## 2016-11-01 ENCOUNTER — Ambulatory Visit (HOSPITAL_COMMUNITY): Payer: BLUE CROSS/BLUE SHIELD | Admitting: Anesthesiology

## 2016-11-01 ENCOUNTER — Ambulatory Visit (HOSPITAL_COMMUNITY)
Admission: RE | Admit: 2016-11-01 | Discharge: 2016-11-01 | Disposition: A | Discharge status: Home / Self Care | Payer: BLUE CROSS/BLUE SHIELD | Source: Ambulatory Visit | Attending: Urology | Admitting: Urology

## 2016-11-01 ENCOUNTER — Ambulatory Visit (HOSPITAL_COMMUNITY): Payer: BLUE CROSS/BLUE SHIELD

## 2016-11-01 ENCOUNTER — Encounter (HOSPITAL_COMMUNITY)
Admission: RE | Disposition: A | Discharge status: Home / Self Care | Payer: Self-pay | Source: Ambulatory Visit | Attending: Urology

## 2016-11-01 ENCOUNTER — Encounter (HOSPITAL_COMMUNITY): Payer: Self-pay

## 2016-11-01 DIAGNOSIS — Z880 Allergy status to penicillin: Secondary | ICD-10-CM | POA: Insufficient documentation

## 2016-11-01 DIAGNOSIS — E1122 Type 2 diabetes mellitus with diabetic chronic kidney disease: Secondary | ICD-10-CM | POA: Diagnosis not present

## 2016-11-01 DIAGNOSIS — E1142 Type 2 diabetes mellitus with diabetic polyneuropathy: Secondary | ICD-10-CM | POA: Diagnosis not present

## 2016-11-01 DIAGNOSIS — D303 Benign neoplasm of bladder: Secondary | ICD-10-CM | POA: Diagnosis not present

## 2016-11-01 DIAGNOSIS — I129 Hypertensive chronic kidney disease with stage 1 through stage 4 chronic kidney disease, or unspecified chronic kidney disease: Secondary | ICD-10-CM | POA: Diagnosis not present

## 2016-11-01 DIAGNOSIS — Z7982 Long term (current) use of aspirin: Secondary | ICD-10-CM | POA: Diagnosis not present

## 2016-11-01 DIAGNOSIS — Z79891 Long term (current) use of opiate analgesic: Secondary | ICD-10-CM | POA: Insufficient documentation

## 2016-11-01 DIAGNOSIS — Z87891 Personal history of nicotine dependence: Secondary | ICD-10-CM | POA: Insufficient documentation

## 2016-11-01 DIAGNOSIS — Z87442 Personal history of urinary calculi: Secondary | ICD-10-CM | POA: Diagnosis not present

## 2016-11-01 DIAGNOSIS — Z7984 Long term (current) use of oral hypoglycemic drugs: Secondary | ICD-10-CM | POA: Insufficient documentation

## 2016-11-01 DIAGNOSIS — D494 Neoplasm of unspecified behavior of bladder: Secondary | ICD-10-CM | POA: Diagnosis not present

## 2016-11-01 DIAGNOSIS — N189 Chronic kidney disease, unspecified: Secondary | ICD-10-CM | POA: Diagnosis not present

## 2016-11-01 DIAGNOSIS — Z79899 Other long term (current) drug therapy: Secondary | ICD-10-CM | POA: Insufficient documentation

## 2016-11-01 DIAGNOSIS — M109 Gout, unspecified: Secondary | ICD-10-CM | POA: Diagnosis not present

## 2016-11-01 DIAGNOSIS — K219 Gastro-esophageal reflux disease without esophagitis: Secondary | ICD-10-CM | POA: Diagnosis not present

## 2016-11-01 HISTORY — PX: TRANSURETHRAL RESECTION OF BLADDER TUMOR: SHX2575

## 2016-11-01 HISTORY — PX: CYSTOSCOPY W/ RETROGRADES: SHX1426

## 2016-11-01 LAB — GLUCOSE, CAPILLARY
Glucose-Capillary: 120 mg/dL — ABNORMAL HIGH (ref 65–99)
Glucose-Capillary: 121 mg/dL — ABNORMAL HIGH (ref 65–99)

## 2016-11-01 SURGERY — TURBT (TRANSURETHRAL RESECTION OF BLADDER TUMOR)
Anesthesia: General

## 2016-11-01 MED ORDER — OXYCODONE-ACETAMINOPHEN 5-325 MG PO TABS: 1.0000 | ORAL_TABLET | Freq: Four times a day (QID) | ORAL | 0 refills | Status: DC | PRN

## 2016-11-01 MED ORDER — LACTATED RINGERS IV SOLN
INTRAVENOUS | Status: DC
  Administered 2016-11-01: 08:00:00 via INTRAVENOUS

## 2016-11-01 MED ORDER — ONDANSETRON HCL 4 MG/2ML IJ SOLN
4.0000 mg | Freq: Once | INTRAMUSCULAR | Status: AC
  Administered 2016-11-01: 4 mg via INTRAVENOUS
  Filled 2016-11-01: qty 2

## 2016-11-01 MED ORDER — PROPOFOL 10 MG/ML IV BOLUS
INTRAVENOUS | Status: AC
  Filled 2016-11-01: qty 20

## 2016-11-01 MED ORDER — FENTANYL CITRATE (PF) 100 MCG/2ML IJ SOLN
25.0000 ug | INTRAMUSCULAR | Status: DC | PRN
  Administered 2016-11-01 (×2): 50 ug via INTRAVENOUS
  Filled 2016-11-01: qty 2

## 2016-11-01 MED ORDER — FENTANYL CITRATE (PF) 100 MCG/2ML IJ SOLN
INTRAMUSCULAR | Status: DC | PRN
  Administered 2016-11-01: 50 ug via INTRAVENOUS

## 2016-11-01 MED ORDER — DIATRIZOATE MEGLUMINE 30 % UR SOLN
URETHRAL | Status: AC
  Filled 2016-11-01: qty 300

## 2016-11-01 MED ORDER — MIDAZOLAM HCL 2 MG/2ML IJ SOLN
INTRAMUSCULAR | Status: AC
  Filled 2016-11-01: qty 2

## 2016-11-01 MED ORDER — CEFAZOLIN SODIUM-DEXTROSE 2-4 GM/100ML-% IV SOLN: 2.0000 g | INTRAVENOUS | Status: DC

## 2016-11-01 MED ORDER — LIDOCAINE HCL (PF) 1 % IJ SOLN
INTRAMUSCULAR | Status: AC
Start: 2016-11-01 — End: ?
  Filled 2016-11-01: qty 5

## 2016-11-01 MED ORDER — CIPROFLOXACIN IN D5W 400 MG/200ML IV SOLN
400.0000 mg | Freq: Once | INTRAVENOUS | Status: AC
  Administered 2016-11-01 (×2): 400 mg via INTRAVENOUS
  Filled 2016-11-01: qty 200

## 2016-11-01 MED ORDER — MIDAZOLAM HCL 2 MG/2ML IJ SOLN
1.0000 mg | INTRAMUSCULAR | Status: AC
  Administered 2016-11-01 (×2): 2 mg via INTRAVENOUS
  Filled 2016-11-01 (×2): qty 2

## 2016-11-01 SURGICAL SUPPLY — 35 items
BAG DRAIN URO TABLE W/ADPT NS (DRAPE) ×4 IMPLANT
BAG HAMPER (MISCELLANEOUS) ×4 IMPLANT
BAG URINE DRAIN TURP 4L (OSTOMY) ×4 IMPLANT
BAG URINE DRAINAGE (UROLOGICAL SUPPLIES) IMPLANT
CATH FOLEY 2WAY SLVR  5CC 22FR (CATHETERS)
CATH FOLEY 2WAY SLVR 30CC 20FR (CATHETERS) IMPLANT
CATH FOLEY 2WAY SLVR 30CC 22FR (CATHETERS) ×4 IMPLANT
CATH FOLEY 2WAY SLVR 5CC 22FR (CATHETERS) IMPLANT
CATH FOLEY 3WAY 30CC 22F (CATHETERS) IMPLANT
CATH FOLEY LATEX FREE 22FR (CATHETERS) ×2
CATH FOLEY LF 22FR (CATHETERS) ×2 IMPLANT
CATH INTERMIT  6FR 70CM (CATHETERS) ×4 IMPLANT
CLOTH BEACON ORANGE TIMEOUT ST (SAFETY) ×4 IMPLANT
ELECT LOOP 22F BIPOLAR SML (ELECTROSURGICAL) ×4
ELECT REM PT RETURN 9FT ADLT (ELECTROSURGICAL) ×4
ELECTRODE LOOP 22F BIPOLAR SML (ELECTROSURGICAL) ×2 IMPLANT
ELECTRODE REM PT RTRN 9FT ADLT (ELECTROSURGICAL) ×2 IMPLANT
EVACUATOR MICROVAS BLADDER (UROLOGICAL SUPPLIES) IMPLANT
EXTRACTOR STONE NITINOL NGAGE (UROLOGICAL SUPPLIES) IMPLANT
GLOVE BIO SURGEON STRL SZ8 (GLOVE) ×4 IMPLANT
GOWN STRL REUS W/ TWL LRG LVL3 (GOWN DISPOSABLE) ×2 IMPLANT
GOWN STRL REUS W/ TWL XL LVL3 (GOWN DISPOSABLE) ×2 IMPLANT
GOWN STRL REUS W/TWL LRG LVL3 (GOWN DISPOSABLE) ×2
GOWN STRL REUS W/TWL XL LVL3 (GOWN DISPOSABLE) ×2
GUIDEWIRE STR DUAL SENSOR (WIRE) ×4 IMPLANT
GUIDEWIRE STR ZIPWIRE 035X150 (MISCELLANEOUS) ×4 IMPLANT
IV NS IRRIG 3000ML ARTHROMATIC (IV SOLUTION) ×4 IMPLANT
KIT ROOM TURNOVER AP CYSTO (KITS) ×4 IMPLANT
MANIFOLD NEPTUNE II (INSTRUMENTS) ×4 IMPLANT
PACK CYSTO (CUSTOM PROCEDURE TRAY) ×4 IMPLANT
PAD ARMBOARD 7.5X6 YLW CONV (MISCELLANEOUS) ×4 IMPLANT
PLUG CATH AND CAP STER (CATHETERS) IMPLANT
SYR 20CC LL (SYRINGE) IMPLANT
SYRINGE IRR TOOMEY STRL 70CC (SYRINGE) IMPLANT
WATER STERILE IRR 500ML POUR (IV SOLUTION) ×4 IMPLANT

## 2016-11-01 NOTE — Anesthesia Postprocedure Evaluation (Signed)
Anesthesia Post Note  Patient: Samuel Caldwell  Procedure(s) Performed: Procedure(s) (LRB): TRANSURETHRAL RESECTION OF BLADDER TUMOR (TURBT) (N/A) CYSTOSCOPY WITH RETROGRADE PYELOGRAM (Bilateral)  Patient location during evaluation: PACU Anesthesia Type: General Level of consciousness: awake and alert, oriented and patient cooperative Pain management: pain level controlled Vital Signs Assessment: post-procedure vital signs reviewed and stable Respiratory status: spontaneous breathing, nonlabored ventilation and respiratory function stable Cardiovascular status: blood pressure returned to baseline Postop Assessment: adequate PO intake Anesthetic complications: no     Last Vitals:  Vitals:   11/01/16 0730  BP: 137/80  Pulse: 72  Resp: 16  Temp: 36.6 C    Last Pain:  Vitals:   11/01/16 0730  TempSrc: Oral                 Danae Oland J

## 2016-11-01 NOTE — Anesthesia Preprocedure Evaluation (Signed)
Anesthesia Evaluation  Patient identified by MRN, date of birth, ID band Patient awake    Reviewed: Allergy & Precautions, H&P , NPO status , Patient's Chart, lab work & pertinent test results  History of Anesthesia Complications Negative for: history of anesthetic complications  Airway Mallampati: II  TM Distance: >3 FB     Dental  (+) Teeth Intact   Pulmonary neg pulmonary ROS, former smoker,    breath sounds clear to auscultation       Cardiovascular hypertension, Pt. on medications  Rhythm:Regular Rate:Normal     Neuro/Psych  Neuromuscular disease (peripheral neuropathy)    GI/Hepatic GERD  Medicated and Controlled,  Endo/Other  diabetes, Well Controlled, Type 2, Oral Hypoglycemic Agents  Renal/GU Bladder tumor      Musculoskeletal   Abdominal   Peds  Hematology   Anesthesia Other Findings   Reproductive/Obstetrics                             Anesthesia Physical Anesthesia Plan  ASA: III  Anesthesia Plan: General   Post-op Pain Management:    Induction: Intravenous  Airway Management Planned: LMA  Additional Equipment:   Intra-op Plan:   Post-operative Plan: Extubation in OR  Informed Consent:   Plan Discussed with:   Anesthesia Plan Comments:         Anesthesia Quick Evaluation

## 2016-11-01 NOTE — Transfer of Care (Signed)
Immediate Anesthesia Transfer of Care Note  Patient: Samuel Caldwell  Procedure(s) Performed: Procedure(s): TRANSURETHRAL RESECTION OF BLADDER TUMOR (TURBT) (N/A) CYSTOSCOPY WITH RETROGRADE PYELOGRAM (Bilateral)  Patient Location: PACU  Anesthesia Type:General  Level of Consciousness: awake, alert  and patient cooperative  Airway & Oxygen Therapy: Patient Spontanous Breathing and Patient connected to face mask oxygen  Post-op Assessment: Report given to RN, Post -op Vital signs reviewed and stable and Patient moving all extremities  Post vital signs: Reviewed and stable  Last Vitals:  Vitals:   11/01/16 0730  BP: 137/80  Pulse: 72  Resp: 16  Temp: 36.6 C    Last Pain:  Vitals:   11/01/16 0730  TempSrc: Oral         Complications: No apparent anesthesia complications

## 2016-11-01 NOTE — Op Note (Signed)
.  Preoperative diagnosis: bladder tumor  Postoperative diagnosis: Same  Procedure: 1 cystoscopy 2. bilateral retrograde pyelography 3.  Intraoperative fluoroscopy, under one hour, with interpretation 4.Transurethral resection of bladder tumor, medium  Attending: Rosie Fate  Anesthesia: General  Estimated blood loss: Minimal  Drains: 22 French foley  Specimens: trigone bladder tumor  Antibiotics: cipro  Findings: 1cm trigone sessile tumor.  Ureteral orifices in normal anatomic location. Nor hydronephrosis or filling defects in either collecting system  Indications: Patient is a 56 year old male with a history of bladder tumor found on office cystoscopy.  After discussing treatment options, they decided proceed with transurethral resection of a bladder tumor.  Procedure her in detail: The patient was brought to the operating room and a brief timeout was done to ensure correct patient, correct procedure, correct site.  General anesthesia was administered patient was placed in dorsal lithotomy position.  Their genitalia was then prepped and draped in usual sterile fashion.  A rigid 16 French cystoscope was passed in the urethra and the bladder.  Bladder was inspected and we noted a 1cm bladder tumor at the trigone.  the ureteral orifices were in the normal orthotopic locations.  a 6 french ureteral catheter was then instilled into the left ureteral orifice.  a gentle retrograde was obtained and findings noted above. We then turned our attention to the right side. a 6 french ureteral catheter was then instilled into the right ureteral orifice.  a gentle retrograde was obtained and findings noted above. We then removed the cystoscope and placed a resectoscope into the bladder.   Using the bipolar resectoscope we removed the bladder tumor down to the base. Hemostasis was then obtained with electrocautery. We then removed the bladder tumor chips and sent them for pathology. We then  re-inspected the bladder and found no residula bleeding.  the bladder was then drained, a 22 French foley was placed and this concluded the procedure which was well tolerated by patient.  Complications: None  Condition: Stable, extubated, transferred to PACU  Plan: Patient is to be discharged home and followup in 4 days for foley catheter removal and pathology discussion.

## 2016-11-01 NOTE — Discharge Instructions (Signed)
Indwelling Urinary Catheter Care, Adult  Take good care of your catheter to keep it working and to prevent problems.  How to wear your catheter  Attach your catheter to your leg with tape (adhesive tape) or a leg strap. Make sure it is not too tight. If you use tape, remove any bits of tape that are already on the catheter.  How to wear a drainage bag  You should have:   A large overnight bag.   A small leg bag.    Overnight Bag  You may wear the overnight bag at any time. Always keep the bag below the level of your bladder but off the floor. When you sleep, put a clean plastic bag in a wastebasket. Then hang the bag inside the wastebasket.  Leg Bag  Never wear the leg bag at night. Always wear the leg bag below your knee. Keep the leg bag secure with a leg strap or tape.  How to care for your skin   Clean the skin around the catheter at least once every day.   Shower every day. Do not take baths.   Put creams, lotions, or ointments on your genital area only as told by your doctor.   Do not use powders, sprays, or lotions on your genital area.  How to clean your catheter and your skin  1. Wash your hands with soap and water.  2. Wet a washcloth in warm water and gentle (mild) soap.  3. Use the washcloth to clean the skin where the catheter enters your body. Clean downward and wipe away from the catheter in small circles. Do not wipe toward the catheter.  4. Pat the area dry with a clean towel. Make sure to clean off all soap.  How to care for your drainage bags  Empty your drainage bag when it is ?- full or at least 2-3 times a day. Replace your drainage bag once a month or sooner if it starts to smell bad or look dirty. Do not clean your drainage bag unless told by your doctor.  Emptying a drainage bag    Supplies Needed   Rubbing alcohol.   Gauze pad or cotton ball.   Tape or a leg strap.    Steps  1. Wash your hands with soap and water.  2. Separate (detach) the bag from your leg.  3. Hold the bag over  the toilet or a clean container. Keep the bag below your hips and bladder. This stops pee (urine) from going back into the tube.  4. Open the pour spout at the bottom of the bag.  5. Empty the pee into the toilet or container. Do not let the pour spout touch any surface.  6. Put rubbing alcohol on a gauze pad or cotton ball.  7. Use the gauze pad or cotton ball to clean the pour spout.  8. Close the pour spout.  9. Attach the bag to your leg with tape or a leg strap.  10. Wash your hands.    Changing a drainage bag  Supplies Needed   Alcohol wipes.   A clean drainage bag.   Adhesive tape or a leg strap.    Steps  1. Wash your hands with soap and water.  2. Separate the dirty bag from your leg.  3. Pinch the rubber catheter with your fingers so that pee does not spill out.  4. Separate the catheter tube from the drainage tube where these tubes connect (at the   connection valve). Do not let the tubes touch any surface.  5. Clean the end of the catheter tube with an alcohol wipe. Use a different alcohol wipe to clean the end of the drainage tube.  6. Connect the catheter tube to the drainage tube of the clean bag.  7. Attach the new bag to the leg with adhesive tape or a leg strap.  8. Wash your hands.    How to prevent infection and other problems   Never pull on your catheter or try to remove it. Pulling can damage tissue in your body.   Always wash your hands before and after touching your catheter.   If a leg strap gets wet, replace it with a dry one.   Drink enough fluids to keep your pee clear or pale yellow, or as told by your doctor.   Do not let the drainage bag or tubing touch the floor.   Wear cotton underwear.   If you are male, wipe from front to back after you poop (have a bowel movement).   Check on the catheter often to make sure it works and the tubing is not twisted.  Get help if:   Your pee is cloudy.   Your pee smells unusually bad.   Your pee is not draining into the bag.   Your  tube gets clogged.   Your catheter starts to leak.   Your bladder feels full.  Get help right away if:   You have redness, swelling, or pain where the catheter enters your body.   You have fluid, pus, or a bad smell coming from the area where the catheter enters your body.   The area where the catheter enters your body feels warm.   You have a fever.   You have pain in your:  ? Stomach (abdomen).  ? Legs.  ? Lower back.  ? Bladder.   You see blood fill the catheter.   Your pee is pink or red.   You feel sick to your stomach (nauseous).   You throw up (vomit).   You have chills.   Your catheter gets pulled out.  This information is not intended to replace advice given to you by your health care provider. Make sure you discuss any questions you have with your health care provider.  Document Released: 09/11/2012 Document Revised: 04/14/2016 Document Reviewed: 10/30/2013  Elsevier Interactive Patient Education  2018 [employer]

## 2016-11-01 NOTE — H&P (Signed)
Urology Admission H&P  Chief Complaint: bladder tumor  History of Present Illness: [name] is a 56yo with a bladder tumor found on office cystoscopy.  Past Medical History:  Diagnosis Date  . Chronic kidney disease    hx of stones  . Diabetes mellitus   . GERD (gastroesophageal reflux disease)   . Gout   . Gout   . History of kidney stones   . Hypertension   . Neuropathy    Past Surgical History:  Procedure Laterality Date  . BACK SURGERY     l4 anf l5  . CARPAL TUNNEL RELEASE     left  . COLONOSCOPY N/A 08/16/2013   Procedure: COLONOSCOPY;  Surgeon: Rogene Houston, MD;  Location: [facility];  Service: Endoscopy;  Laterality: N/A;  930  . STONE EXTRACTION WITH BASKET  07/21/2011   Procedure: STONE EXTRACTION WITH BASKET;  Surgeon: Marissa Nestle, MD;  Location: [facility] ORS;  Service: Urology;  Laterality: Left;    Home Medications:  Prescriptions Prior to Admission  Medication Sig Dispense Refill Last Dose  . allopurinol (ZYLOPRIM) 300 MG tablet Take 300 mg by mouth daily.   11/01/2016 at Unknown time  . aspirin EC 81 MG tablet Take 81 mg by mouth daily.   Past Month at Unknown time  . atorvastatin (LIPITOR) 40 MG tablet Take 1 tablet (40 mg total) by mouth daily at 6 PM. 30 tablet 1 10/31/2016 at Unknown time  . Bioflavonoid Products (ESTER-C) 1000-50 MG TABS Take 1 tablet by mouth daily.   10/31/2016 at Unknown time  . HYDROcodone-acetaminophen (NORCO) 7.5-325 MG per tablet Take 0.5-1 tablets by mouth every 6 (six) hours as needed for moderate pain.   Past Week at Unknown time  . lisinopril (PRINIVIL,ZESTRIL) 10 MG tablet Take 10 mg by mouth daily.   10/31/2016 at Unknown time  . meloxicam (MOBIC) 15 MG tablet Take 15 mg by mouth daily.   10/31/2016 at Unknown time  . Multiple Vitamin (MULITIVITAMIN WITH MINERALS) TABS Take 1 tablet by mouth daily.   Past Month at Unknown time  . pioglitazone-metformin (ACTOPLUS MET) 15-850 MG tablet Take 1 tablet by mouth 2 (two) times daily with  a meal.   10/31/2016 at Unknown time  . tiZANidine (ZANAFLEX) 4 MG tablet Take 4 mg by mouth every 6 (six) hours as needed for muscle spasms.   Past Month at Unknown time  . TRULICITY 1.5 ZO/1.0RU SOPN Inject 1.5 mg into the muscle every Saturday.  11 Past Week at Unknown time   Allergies:  Allergies  Allergen Reactions  . Penicillins Anaphylaxis, Hives and Swelling    Has patient had a PCN reaction causing immediate rash, facial/tongue/throat swelling, SOB or lightheadedness with hypotension: Yes Has patient had a PCN reaction causing severe rash involving mucus membranes or skin necrosis: Yes Has patient had a PCN reaction that required hospitalization: No Has patient had a PCN reaction occurring within the last 10 years: No If all of the above answers are "NO", then may proceed with Cephalosporin use.     Family History  Problem Relation Age of Onset  . Anesthesia problems Neg Hx   . Hypotension Neg Hx   . Malignant hyperthermia Neg Hx   . Pseudochol deficiency Neg Hx    Social History:  reports that he quit smoking about 19 years ago. His smoking use included Cigarettes. He has a 10.00 pack-year smoking history. His smokeless tobacco use includes Snuff. He reports that he drinks alcohol. He  reports that he does not use drugs.  Review of Systems  All other systems reviewed and are negative.   Physical Exam:  Vital signs in last 24 hours: Temp:  [97.9 F (36.6 C)] 97.9 F (36.6 C) (06/04 0730) Pulse Rate:  [72] 72 (06/04 0730) Resp:  [16] 16 (06/04 0730) BP: (137)/(80) 137/80 (06/04 0730) SpO2:  [97 %] 97 % (06/04 0730) Physical Exam  Constitutional: He is oriented to person, place, and time. He appears well-developed and well-nourished.  HENT:  Head: Normocephalic and atraumatic.  Eyes: EOM are normal. Pupils are equal, round, and reactive to light.  Neck: Normal range of motion. No thyromegaly present.  Cardiovascular: Normal rate and regular rhythm.   Respiratory:  Effort normal. No respiratory distress.  GI: Soft. He exhibits no distension.  Musculoskeletal: Normal range of motion. He exhibits no edema.  Neurological: He is alert and oriented to person, place, and time.  Skin: Skin is warm and dry.  Psychiatric: He has a normal mood and affect. His behavior is normal. Judgment and thought content normal.    Laboratory Data:  Results for orders placed or performed during the hospital encounter of 11/01/16 (from the past 24 hour(s))  Glucose, capillary     Status: Abnormal   Collection Time: 11/01/16  7:10 AM  Result Value Ref Range   Glucose-Capillary 120 (H) 65 - 99 mg/dL   No results found for this or any previous visit (from the past 240 hour(s)). Creatinine:  Recent Labs  10/27/16 1021  CREATININE 0.84   Baseline Creatinine: 0.8  Impression/Assessment:  55yo with a bladder tumor  Plan:  The risks/benefits/alternatives to TURBT was explained to the patient and he understands and wishes to proceed with surgery  Nicolette Bang 11/01/2016, 8:35 AM

## 2016-11-01 NOTE — Anesthesia Procedure Notes (Signed)
Procedure Name: LMA Insertion Date/Time: 11/01/2016 9:12 AM Performed by: Charmaine Downs Pre-anesthesia Checklist: Patient identified, Patient being monitored, Emergency Drugs available, Timeout performed and Suction available Patient Re-evaluated:Patient Re-evaluated prior to inductionOxygen Delivery Method: Circle System Utilized Preoxygenation: Pre-oxygenation with 100% oxygen Intubation Type: IV induction Ventilation: Mask ventilation without difficulty LMA: LMA inserted LMA Size: 4.0 Number of attempts: 1 Placement Confirmation: positive ETCO2 and breath sounds checked- equal and bilateral Tube secured with: Tape Dental Injury: Teeth and Oropharynx as per pre-operative assessment

## 2016-11-02 ENCOUNTER — Encounter (HOSPITAL_COMMUNITY): Payer: Self-pay | Admitting: Urology

## 2016-12-21 ENCOUNTER — Ambulatory Visit (INDEPENDENT_AMBULATORY_CARE_PROVIDER_SITE_OTHER): Payer: BLUE CROSS/BLUE SHIELD | Admitting: Urology

## 2016-12-21 DIAGNOSIS — R310 Gross hematuria: Secondary | ICD-10-CM | POA: Diagnosis not present

## 2016-12-26 ENCOUNTER — Observation Stay (HOSPITAL_COMMUNITY)
Admission: AD | Admit: 2016-12-26 | Discharge: 2016-12-27 | Disposition: A | Discharge status: Home / Self Care | Payer: BLUE CROSS/BLUE SHIELD | Source: Other Acute Inpatient Hospital | Attending: Internal Medicine | Admitting: Internal Medicine

## 2016-12-26 ENCOUNTER — Encounter (HOSPITAL_COMMUNITY): Payer: Self-pay | Admitting: Family Medicine

## 2016-12-26 ENCOUNTER — Observation Stay (HOSPITAL_COMMUNITY): Payer: BLUE CROSS/BLUE SHIELD

## 2016-12-26 ENCOUNTER — Observation Stay (HOSPITAL_BASED_OUTPATIENT_CLINIC_OR_DEPARTMENT_OTHER): Payer: BLUE CROSS/BLUE SHIELD

## 2016-12-26 ENCOUNTER — Telehealth: Payer: Self-pay | Admitting: Internal Medicine

## 2016-12-26 DIAGNOSIS — R0789 Other chest pain: Secondary | ICD-10-CM | POA: Diagnosis not present

## 2016-12-26 DIAGNOSIS — N189 Chronic kidney disease, unspecified: Secondary | ICD-10-CM | POA: Diagnosis not present

## 2016-12-26 DIAGNOSIS — E119 Type 2 diabetes mellitus without complications: Secondary | ICD-10-CM | POA: Diagnosis not present

## 2016-12-26 DIAGNOSIS — I129 Hypertensive chronic kidney disease with stage 1 through stage 4 chronic kidney disease, or unspecified chronic kidney disease: Secondary | ICD-10-CM | POA: Insufficient documentation

## 2016-12-26 DIAGNOSIS — Z7982 Long term (current) use of aspirin: Secondary | ICD-10-CM | POA: Diagnosis not present

## 2016-12-26 DIAGNOSIS — F17210 Nicotine dependence, cigarettes, uncomplicated: Secondary | ICD-10-CM | POA: Diagnosis not present

## 2016-12-26 DIAGNOSIS — I160 Hypertensive urgency: Secondary | ICD-10-CM | POA: Diagnosis not present

## 2016-12-26 DIAGNOSIS — R9439 Abnormal result of other cardiovascular function study: Secondary | ICD-10-CM | POA: Diagnosis not present

## 2016-12-26 DIAGNOSIS — E1140 Type 2 diabetes mellitus with diabetic neuropathy, unspecified: Secondary | ICD-10-CM | POA: Insufficient documentation

## 2016-12-26 DIAGNOSIS — R079 Chest pain, unspecified: Secondary | ICD-10-CM

## 2016-12-26 DIAGNOSIS — Z880 Allergy status to penicillin: Secondary | ICD-10-CM | POA: Insufficient documentation

## 2016-12-26 DIAGNOSIS — M109 Gout, unspecified: Secondary | ICD-10-CM | POA: Diagnosis not present

## 2016-12-26 DIAGNOSIS — I10 Essential (primary) hypertension: Secondary | ICD-10-CM | POA: Diagnosis not present

## 2016-12-26 DIAGNOSIS — K219 Gastro-esophageal reflux disease without esophagitis: Secondary | ICD-10-CM | POA: Insufficient documentation

## 2016-12-26 DIAGNOSIS — E785 Hyperlipidemia, unspecified: Secondary | ICD-10-CM | POA: Diagnosis not present

## 2016-12-26 DIAGNOSIS — E1122 Type 2 diabetes mellitus with diabetic chronic kidney disease: Secondary | ICD-10-CM | POA: Insufficient documentation

## 2016-12-26 DIAGNOSIS — Z7984 Long term (current) use of oral hypoglycemic drugs: Secondary | ICD-10-CM | POA: Diagnosis not present

## 2016-12-26 DIAGNOSIS — Z6841 Body Mass Index (BMI) 40.0 and over, adult: Secondary | ICD-10-CM | POA: Diagnosis not present

## 2016-12-26 DIAGNOSIS — E1165 Type 2 diabetes mellitus with hyperglycemia: Secondary | ICD-10-CM | POA: Insufficient documentation

## 2016-12-26 LAB — NM MYOCAR MULTI W/SPECT W/WALL MOTION / EF
CHL CUP MPHR: 167 {beats}/min
CSEPED: 5 min
CSEPEW: 9.3 METS
CSEPPHR: 169 {beats}/min
Exercise duration (sec): 30 s
Percent HR: 101 %
Rest HR: 63 {beats}/min

## 2016-12-26 LAB — CBC WITH DIFFERENTIAL/PLATELET
BASOS ABS: 0 10*3/uL (ref 0.0–0.1)
BASOS PCT: 0 %
EOS ABS: 0.2 10*3/uL (ref 0.0–0.7)
EOS PCT: 3 %
HCT: 37.2 % — ABNORMAL LOW (ref 39.0–52.0)
Hemoglobin: 12.2 g/dL — ABNORMAL LOW (ref 13.0–17.0)
Lymphocytes Relative: 38 %
Lymphs Abs: 2.5 10*3/uL (ref 0.7–4.0)
MCH: 29.8 pg (ref 26.0–34.0)
MCHC: 32.8 g/dL (ref 30.0–36.0)
MCV: 91 fL (ref 78.0–100.0)
Monocytes Absolute: 0.5 10*3/uL (ref 0.1–1.0)
Monocytes Relative: 8 %
Neutro Abs: 3.2 10*3/uL (ref 1.7–7.7)
Neutrophils Relative %: 51 %
PLATELETS: 249 10*3/uL (ref 150–400)
RBC: 4.09 MIL/uL — AB (ref 4.22–5.81)
RDW: 14.2 % (ref 11.5–15.5)
WBC: 6.5 10*3/uL (ref 4.0–10.5)

## 2016-12-26 LAB — COMPREHENSIVE METABOLIC PANEL
ALT: 22 U/L (ref 17–63)
AST: 20 U/L (ref 15–41)
Albumin: 3.4 g/dL — ABNORMAL LOW (ref 3.5–5.0)
Alkaline Phosphatase: 63 U/L (ref 38–126)
Anion gap: 5 (ref 5–15)
BILIRUBIN TOTAL: 0.7 mg/dL (ref 0.3–1.2)
BUN: 10 mg/dL (ref 6–20)
CALCIUM: 8.6 mg/dL — AB (ref 8.9–10.3)
CO2: 28 mmol/L (ref 22–32)
CREATININE: 0.75 mg/dL (ref 0.61–1.24)
Chloride: 107 mmol/L (ref 101–111)
Glucose, Bld: 134 mg/dL — ABNORMAL HIGH (ref 65–99)
Potassium: 4.1 mmol/L (ref 3.5–5.1)
Sodium: 140 mmol/L (ref 135–145)
TOTAL PROTEIN: 6.4 g/dL — AB (ref 6.5–8.1)

## 2016-12-26 LAB — PROTIME-INR
INR: 1.13
PROTHROMBIN TIME: 14.5 s (ref 11.4–15.2)

## 2016-12-26 LAB — MRSA PCR SCREENING: MRSA BY PCR: NEGATIVE

## 2016-12-26 LAB — GLUCOSE, CAPILLARY
GLUCOSE-CAPILLARY: 131 mg/dL — AB (ref 65–99)
Glucose-Capillary: 126 mg/dL — ABNORMAL HIGH (ref 65–99)
Glucose-Capillary: 137 mg/dL — ABNORMAL HIGH (ref 65–99)
Glucose-Capillary: 130 mg/dL — ABNORMAL HIGH (ref 65–99)
Glucose-Capillary: 126 mg/dL — ABNORMAL HIGH (ref 65–99)

## 2016-12-26 LAB — TROPONIN I: Troponin I: <0.03 ng/mL (ref <0.03)

## 2016-12-26 LAB — HIV ANTIBODY (ROUTINE TESTING W REFLEX): HIV Screen 4th Generation wRfx: NONREACTIVE

## 2016-12-26 MED ORDER — LISINOPRIL 10 MG PO TABS
10.0000 mg | ORAL_TABLET | Freq: Every day | ORAL | Status: DC
  Administered 2016-12-26 – 2016-12-27 (×2): 10 mg via ORAL
  Filled 2016-12-26 (×2): qty 1

## 2016-12-26 MED ORDER — NITROGLYCERIN 0.4 MG SL SUBL
0.4000 mg | SUBLINGUAL_TABLET | SUBLINGUAL | Status: DC | PRN
  Administered 2016-12-26: 0.4 mg via SUBLINGUAL
  Filled 2016-12-26: qty 1

## 2016-12-26 MED ORDER — MORPHINE SULFATE (PF) 2 MG/ML IV SOLN
2.0000 mg | INTRAVENOUS | Status: DC | PRN
Start: 2016-12-26 — End: 2016-12-27

## 2016-12-26 MED ORDER — INSULIN ASPART 100 UNIT/ML ~~LOC~~ SOLN
0.0000 [IU] | SUBCUTANEOUS | Status: DC
  Administered 2016-12-26: 1 [IU] via SUBCUTANEOUS

## 2016-12-26 MED ORDER — TECHNETIUM TC 99M TETROFOSMIN IV KIT
30.0000 | PACK | Freq: Once | INTRAVENOUS | Status: AC | PRN
  Administered 2016-12-26: 30 via INTRAVENOUS

## 2016-12-26 MED ORDER — ASPIRIN 81 MG PO CHEW
81.0000 mg | CHEWABLE_TABLET | ORAL | Status: AC
  Administered 2016-12-27: 81 mg via ORAL
  Filled 2016-12-26: qty 1

## 2016-12-26 MED ORDER — ASPIRIN EC 81 MG PO TBEC
81.0000 mg | DELAYED_RELEASE_TABLET | Freq: Every day | ORAL | Status: DC
  Administered 2016-12-26 – 2016-12-27 (×2): 81 mg via ORAL
  Filled 2016-12-26 (×2): qty 1

## 2016-12-26 MED ORDER — ONDANSETRON HCL 4 MG/2ML IJ SOLN: 4.0000 mg | Freq: Four times a day (QID) | INTRAMUSCULAR | Status: DC | PRN

## 2016-12-26 MED ORDER — ADULT MULTIVITAMIN W/MINERALS CH
1.0000 | ORAL_TABLET | Freq: Every day | ORAL | Status: DC
  Administered 2016-12-26 – 2016-12-27 (×2): 1 via ORAL
  Filled 2016-12-26 (×2): qty 1

## 2016-12-26 MED ORDER — PANTOPRAZOLE SODIUM 40 MG PO TBEC
40.0000 mg | DELAYED_RELEASE_TABLET | Freq: Every day | ORAL | Status: DC
  Administered 2016-12-26 – 2016-12-27 (×2): 40 mg via ORAL
  Filled 2016-12-26 (×2): qty 1

## 2016-12-26 MED ORDER — SODIUM CHLORIDE 0.9 % WEIGHT BASED INFUSION
3.0000 mL/kg/h | INTRAVENOUS | Status: DC
  Administered 2016-12-27: 3 mL/kg/h via INTRAVENOUS

## 2016-12-26 MED ORDER — TIZANIDINE HCL 4 MG PO TABS: 4.0000 mg | ORAL_TABLET | Freq: Four times a day (QID) | ORAL | Status: DC | PRN

## 2016-12-26 MED ORDER — ALLOPURINOL 300 MG PO TABS
300.0000 mg | ORAL_TABLET | Freq: Every day | ORAL | Status: DC
  Administered 2016-12-26 – 2016-12-27 (×2): 300 mg via ORAL
  Filled 2016-12-26 (×2): qty 1

## 2016-12-26 MED ORDER — INSULIN ASPART 100 UNIT/ML ~~LOC~~ SOLN
0.0000 [IU] | Freq: Three times a day (TID) | SUBCUTANEOUS | Status: DC
  Administered 2016-12-26 – 2016-12-27 (×2): 1 [IU] via SUBCUTANEOUS
  Administered 2016-12-27: 2 [IU] via SUBCUTANEOUS

## 2016-12-26 MED ORDER — SODIUM CHLORIDE 0.9 % IV SOLN: 250.0000 mL | INTRAVENOUS | Status: DC | PRN

## 2016-12-26 MED ORDER — SODIUM CHLORIDE 0.9% FLUSH
3.0000 mL | Freq: Two times a day (BID) | INTRAVENOUS | Status: DC
  Administered 2016-12-26 – 2016-12-27 (×2): 3 mL via INTRAVENOUS

## 2016-12-26 MED ORDER — OXYCODONE-ACETAMINOPHEN 5-325 MG PO TABS: 1.0000 | ORAL_TABLET | Freq: Four times a day (QID) | ORAL | Status: DC | PRN

## 2016-12-26 MED ORDER — ATORVASTATIN CALCIUM 40 MG PO TABS
40.0000 mg | ORAL_TABLET | Freq: Every day | ORAL | Status: DC
  Administered 2016-12-26 – 2016-12-27 (×2): 40 mg via ORAL
  Filled 2016-12-26 (×2): qty 1

## 2016-12-26 MED ORDER — METOPROLOL TARTRATE 5 MG/5ML IV SOLN: 5.0000 mg | INTRAVENOUS | Status: DC | PRN

## 2016-12-26 MED ORDER — TECHNETIUM TC 99M TETROFOSMIN IV KIT
10.0000 | PACK | Freq: Once | INTRAVENOUS | Status: AC | PRN
  Administered 2016-12-26: 10 via INTRAVENOUS

## 2016-12-26 MED ORDER — HEPARIN SODIUM (PORCINE) 5000 UNIT/ML IJ SOLN
5000.0000 [IU] | Freq: Three times a day (TID) | INTRAMUSCULAR | Status: DC
  Administered 2016-12-26 – 2016-12-27 (×4): 5000 [IU] via SUBCUTANEOUS
  Filled 2016-12-26 (×3): qty 1

## 2016-12-26 MED ORDER — SODIUM CHLORIDE 0.9 % WEIGHT BASED INFUSION
1.0000 mL/kg/h | INTRAVENOUS | Status: DC
  Administered 2016-12-27 (×2): 1 mL/kg/h via INTRAVENOUS

## 2016-12-26 MED ORDER — ACETAMINOPHEN 325 MG PO TABS: 650.0000 mg | ORAL_TABLET | ORAL | Status: DC | PRN

## 2016-12-26 MED ORDER — SODIUM CHLORIDE 0.9% FLUSH: 3.0000 mL | INTRAVENOUS | Status: DC | PRN

## 2016-12-26 NOTE — Progress Notes (Signed)
PROGRESS NOTE   Samuel Mccorkle Sr.  OEU:[id number]    DOB: [birth year]    DOA: 12/26/2016  PCP: Glenda Chroman, MD   I have briefly reviewed patients previous medical records in [facility].  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 Reported stress test approximately 4 years ago is normal. No personal or family history of VTE. Cardiology consulted. Stress test: Intermediate risk. As per cardiology, plan for cardiac cath 7/30.   Assessment & Plan:   Principal Problem:   Chest pain Active Problems:   Diabetes mellitus (HCC)   HTN (hypertension)   Hyperlipidemia   Hypertensive urgency   1. Chest pain: EKG without acute changes. Troponin 1 negative. Cardiology consulted. Exercise Myoview was abnormal/intermediate risk. As per cardiology, distal anterior wall apical reversible defect. Plan for cardiac cath 7/30. Continue aspirin, statins. No recurrence of chest pain. 2. Type II DM: Hold oral hypoglycemics. Reasonable inpatient control on SSI. 3. Essential hypertension with hypertensive urgency: Presented with BP of 193/89. Now improved. Continue lisinopril. Consider beta blockers. 4. Hyperlipidemia: Continue statins. 5. Morbid obesity/Body mass index is 41.42 kg/m. Needs to lose weight. 6. Social issues: Stressed by surveillance divorce and plans to move in with his parents with 2 small kids.   DVT prophylaxis: Subcutaneous heparin Code Status: Full Family Communication: Discussed in detail with patient's spouse at bedside. Disposition: To be determined pending further cardiac workup.   Consultants:  Cardiology   Procedures:  None  Antimicrobials:  None    Subjective: Seen this afternoon after stress test this morning. No recurrence of chest pain. Remote history  of smoking 20 years ago. 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 No prior chest pain with exertion or at rest.   ROS: Denies.  Objective:  Vitals:   12/26/16 0947 12/26/16 0949 12/26/16 0951 12/26/16 1142  BP: (!) 164/57 133/62 140/74 111/75  Pulse:    60  Resp:      Temp:    97.9 F (36.6 C)  TempSrc:    Oral  SpO2:    97%  Weight:      Height:        Examination:  General exam: Pleasant young male, moderately built and obese, lying comfortably propped up in bed. Respiratory system: Clear to auscultation. Respiratory effort normal. Cardiovascular system: S1 & S2 heard, RRR. No JVD, murmurs, rubs, gallops or clicks. No pedal edema. Telemetry: Sinus bradycardia in the 50s-sinus rhythm. Gastrointestinal system: Abdomen is nondistended, soft and nontender. No organomegaly or masses felt. Normal bowel sounds heard. Central nervous system: Alert and oriented. No focal neurological deficits. Extremities: Symmetric 5 x 5 power. Skin: No rashes, lesions or ulcers Psychiatry: Judgement and insight appear normal. Mood & affect appropriate.     Data Reviewed: I have personally reviewed following labs and imaging studies  CBC:  Recent Labs Lab 12/26/16 0456  WBC 6.5  NEUTROABS 3.2  HGB 12.2*  HCT 37.2*  MCV 91.0  PLT 154   Basic Metabolic Panel:  Recent Labs Lab 12/26/16 0456  NA 140  K 4.1  CL 107  CO2 28  GLUCOSE 134*  BUN 10  CREATININE 0.75  CALCIUM 8.6*   Liver Function Tests:  Recent Labs Lab 12/26/16 0456  AST 20  ALT 22  ALKPHOS 63  BILITOT 0.7  PROT 6.4*  ALBUMIN 3.4*   Coagulation Profile: No results for input(s): INR, PROTIME in the last 168 hours. Cardiac Enzymes:  Recent Labs Lab 12/26/16 0456  TROPONINI <0.03   HbA1C: No results for input(s): HGBA1C in the last 72 hours. CBG:  Recent Labs Lab 12/26/16 0501 12/26/16 0750 12/26/16 1141  GLUCAP 126* 131* 137*    Recent Results (from the past 240 hour(s))  MRSA PCR  Screening     Status: None   Collection Time: 12/26/16  4:53 AM  Result Value Ref Range Status   MRSA by PCR NEGATIVE NEGATIVE Final    Comment:        The GeneXpert MRSA Assay (FDA approved for NASAL specimens only), is one component of a comprehensive MRSA colonization surveillance program. It is not intended to diagnose MRSA infection nor to guide or monitor treatment for MRSA infections.          Radiology Studies: Dg Chest 2 View  Result Date: 12/26/2016 CLINICAL DATA:  Chest pain. EXAM: CHEST  2 VIEW COMPARISON:  12/25/2016 FINDINGS: The heart size and mediastinal contours are within normal limits. Both lungs are clear. The visualized skeletal structures are unremarkable. IMPRESSION: No active cardiopulmonary disease. Electronically Signed   By: Kerby Moors M.D.   On: 12/26/2016 08:05   Nm Myocar Multi W/spect W/wall Motion / Ef  Result Date: 12/26/2016 CLINICAL DATA:  Chest pain.  Smoker.  Hypertension. EXAM: MYOCARDIAL IMAGING WITH SPECT (REST AND EXERCISE) GATED LEFT VENTRICULAR WALL MOTION STUDY LEFT VENTRICULAR EJECTION FRACTION TECHNIQUE: Standard myocardial SPECT imaging was performed after resting intravenous injection of 10 mCi Tc-8m tetrofosmin. Subsequently, exercise tolerance test was performed by the patient under the supervision of the Cardiology staff. At peak-stress, 30 mCi Tc-50m tetrofosmin was injected intravenously and standard myocardial SPECT imaging was performed. Quantitative gated imaging was also performed to evaluate left ventricular wall motion, and estimate left ventricular ejection fraction. COMPARISON:  None. FINDINGS: Perfusion: There is a medium size focus of mild reversibility involving the apex. Wall Motion: Normal left ventricular wall motion. No left ventricular dilation. Left Ventricular Ejection Fraction: 55 % End diastolic volume 063 ml End systolic volume 72 ml IMPRESSION: 1. Medium size focus of mild reversibility involves the left apex.  2. Normal left ventricular wall motion. 3. Left ventricular ejection fraction 55% 4. Non invasive risk stratification*: Intermediate *2012 Appropriate Use Criteria for Coronary Revascularization Focused Update: J Am Coll Cardiol. 0160;10(9):323-557. http://content.[email or web address].com.aspx?articleid=[email or web address] These results will be called to the ordering clinician or representative by the Radiologist Assistant, and communication documented in the PACS or zVision Dashboard. Electronically Signed   By: Kerby Moors M.D.   On: 12/26/2016 13:01        Scheduled Meds: . allopurinol  300 mg Oral Daily  . aspirin EC  81 mg Oral Daily  . atorvastatin  40 mg Oral q1800  . heparin  5,000 Units Subcutaneous Q8H  . insulin aspart  0-9 Units Subcutaneous Q4H  . lisinopril  10 mg Oral Daily  . multivitamin with minerals  1 tablet Oral Daily  . pantoprazole  40 mg Oral Daily   Continuous Infusions:   LOS: 0 days     Jacquita Mulhearn, MD, FACP, FHM. [facility] Pager [phone]  If 7PM-7AM, please contact night-coverage [email or web address] Password [email or web address] 12/26/2016, 1:52 PM

## 2016-12-26 NOTE — Progress Notes (Signed)
   Progress Note  Patient Name: Samuel Yam Sr. Date of Encounter: 12/26/2016  Primary Cardiologist: Branch  Subjective   No chest pain BP better Reviewed admitting notes labs CXR pending Time to review 15 minutes alone   Inpatient Medications    Scheduled Meds: . allopurinol  300 mg Oral Daily  . aspirin EC  81 mg Oral Daily  . atorvastatin  40 mg Oral q1800  . heparin  5,000 Units Subcutaneous Q8H  . insulin aspart  0-9 Units Subcutaneous Q4H  . lisinopril  10 mg Oral Daily  . multivitamin with minerals  1 tablet Oral Daily  . pantoprazole  40 mg Oral Daily   Continuous Infusions:  PRN Meds: acetaminophen, metoprolol tartrate, morphine injection, nitroGLYCERIN, ondansetron (ZOFRAN) IV, oxyCODONE-acetaminophen, tiZANidine   Vital Signs    Vitals:   12/26/16 0430 12/26/16 0446 12/26/16 0520 12/26/16 0525  BP: 123/74  114/65 100/70  Pulse: 84     Resp: 18     Temp: 97.7 F (36.5 C)     TempSrc: Oral     SpO2: 98% 98% 98% 94%  Weight: 280 lb 8 oz (127.2 kg)     Height: 5\' 9"  (1.753 m)      No intake or output data in the 24 hours ending 12/26/16 0748 Filed Weights   12/26/16 0430  Weight: 280 lb 8 oz (127.2 kg)    Telemetry    NSR 12/26/2016  - Personally Reviewed  ECG    NSR normal ecg  - Personally Reviewed  Physical Exam  Obese white male Tatoos on both arms  GEN: No acute distress.   Neck: No JVD Cardiac: RRR, no murmurs, rubs, or gallops.  Respiratory: Clear to auscultation bilaterally. GI: Soft, nontender, non-distended  MS: No edema; No deformity. Neuro:  Nonfocal  Psych: Normal affect   Labs    Chemistry Recent Labs Lab 12/26/16 0456  NA 140  K 4.1  CL 107  CO2 28  GLUCOSE 134*  BUN 10  CREATININE 0.75  CALCIUM 8.6*  PROT 6.4*  ALBUMIN 3.4*  AST 20  ALT 22  ALKPHOS 63  BILITOT 0.7  GFRNONAA >60  GFRAA >60  ANIONGAP 5     Hematology Recent Labs Lab 12/26/16 0456  WBC 6.5  RBC 4.09*  HGB 12.2*  HCT 37.2*    MCV 91.0  MCH 29.8  MCHC 32.8  RDW 14.2  PLT 249    Cardiac Enzymes Recent Labs Lab 12/26/16 0456  TROPONINI <0.03   No results for input(s): TROPIPOC in the last 168 hours.   BNPNo results for input(s): BNP, PROBNP in the last 168 hours.   DDimer No results for input(s): DDIMER in the last 168 hours.   Radiology    No results found.  Cardiac Studies   None  Patient Profile     56 y.o. male admitted with HTN and chest pain No documented CAD R/O no acute ECG changes  Assessment & Plan    1) Chest Pain:  R/O will order exercise myovue for this am Spent 20 minutes discussing admission With patient wife and daughter and various ways to risk stratify for CAD 2) BP:  Improved this am ? Related to stress discussed weight loss and low sodium diet f/u Vyas 3) Cholesterol on statin  Ok to d/c home with outpatient f/u Dr Harl Bowie if myovue low risk   Signed, Jenkins Rouge, MD  12/26/2016, 7:48 AM

## 2016-12-26 NOTE — H&P (Signed)
History and Physical    Samuel Caldwell QRF:[id number] DOB: May 05, 1961 DOA: 12/26/2016  PCP: Glenda Chroman, MD   Patient coming from: Home, by way of [facility]   Chief Complaint: Chest pain   HPI: Samuel Stratton Sr. is a 56 y.o. male with medical history significant for type 2 diabetes mellitus, hypertension, GERD, and hyperlipidemia, now presenting to the emergency department for evaluation of chest pain. Patient reports that he had been in his usual state of health and was at rest, watching TV, when he developed the acute onset of pain in the left chest described as severe in intensity, achy in character, radiating to the left arm, and associated with shortness of breath, nausea, and diaphoresis. Patient is a nonsmoker and denies any family history of heart disease. He denies experiencing similar symptoms previously. He reports having a stress test approximately 4 years ago which he reports as normal. He states that is diabetes has been well controlled recently and there has not been any recent illness. Denies any personal or family history of VTE.   [facility] ED Course: Upon arrival to the ED, patient is found to be afebrile, saturating well on room air, hypertensive to 193/89, and with vitals otherwise stable. EKG feature to sinus rhythm with ventricular trigeminy and no apparent ST-T abnormality. Chemistry panel was notable for serum glucose 166 and CBC was unremarkable. INR was within the normal limits and troponin was undetectable. Patient was treated with 1 inch and nitroglycerin ointment and 324 mg of aspirin in the ED. Blood pressure improved and chest pain also improved down to a "2/10" after the nitroglycerin was applied. Cardiology was consulted by the ED physician and advised for medical admission to [facility] for ongoing evaluation and management of chest pain concerning for possible unstable angina.  Review of Systems:  All other systems reviewed and  apart from HPI, are negative.  Past Medical History:  Diagnosis Date  . Chronic kidney disease    hx of stones  . Diabetes mellitus   . GERD (gastroesophageal reflux disease)   . Gout   . Gout   . History of kidney stones   . Hypertension   . Neuropathy     Past Surgical History:  Procedure Laterality Date  . BACK SURGERY     l4 anf l5  . CARPAL TUNNEL RELEASE     left  . COLONOSCOPY N/A 08/16/2013   Procedure: COLONOSCOPY;  Surgeon: Rogene Houston, MD;  [facility];  Service: Endoscopy;  Laterality: N/A;  930  . CYSTOSCOPY W/ RETROGRADES Bilateral 11/01/2016   Procedure: CYSTOSCOPY WITH RETROGRADE PYELOGRAM;  Surgeon: Cleon Gustin, MD;  Location: [facility];  Service: Urology;  Laterality: Bilateral;  . STONE EXTRACTION WITH BASKET  07/21/2011   Procedure: STONE EXTRACTION WITH BASKET;  Surgeon: Marissa Nestle, MD;  Location: [facility];  Service: Urology;  Laterality: Left;  . TRANSURETHRAL RESECTION OF BLADDER TUMOR N/A 11/01/2016   Procedure: TRANSURETHRAL RESECTION OF BLADDER TUMOR (TURBT);  Surgeon: Cleon Gustin, MD;  Location: [facility];  Service: Urology;  Laterality: N/A;     reports that he quit smoking about 19 years ago. His smoking use included Cigarettes. He has a 10.00 pack-year smoking history. His smokeless tobacco use includes Snuff. He reports that he drinks alcohol. He reports that he does not use drugs.  Allergies  Allergen Reactions  . Penicillins Anaphylaxis, Hives and Swelling    Has patient had  a PCN reaction causing immediate rash, facial/tongue/throat swelling, SOB or lightheadedness with hypotension: Yes Has patient had a PCN reaction causing severe rash involving mucus membranes or skin necrosis: Yes Has patient had a PCN reaction that required hospitalization: No Has patient had a PCN reaction occurring within the last 10 years: No If all of the above answers are "NO", then may proceed with Cephalosporin use.     Family History    Problem Relation Age of Onset  . Anesthesia problems Neg Hx   . Hypotension Neg Hx   . Malignant hyperthermia Neg Hx   . Pseudochol deficiency Neg Hx      Prior to Admission medications   Medication Sig Start Date End Date Taking? Authorizing Provider  allopurinol (ZYLOPRIM) 300 MG tablet Take 300 mg by mouth daily.    [provider]  aspirin EC 81 MG tablet Take 81 mg by mouth daily.    [provider]  atorvastatin (LIPITOR) 40 MG tablet Take 1 tablet (40 mg total) by mouth daily at 6 PM. 05/06/14   Isaac Bliss, Rayford Halsted, MD  Bioflavonoid Products (ESTER-C) 1000-50 MG TABS Take 1 tablet by mouth daily.    [provider]  HYDROcodone-acetaminophen (NORCO) 7.5-325 MG per tablet Take 0.5-1 tablets by mouth every 6 (six) hours as needed for moderate pain.    [provider]  lisinopril (PRINIVIL,ZESTRIL) 10 MG tablet Take 10 mg by mouth daily.    [provider]  meloxicam (MOBIC) 15 MG tablet Take 15 mg by mouth daily.    [provider]  Multiple Vitamin (MULITIVITAMIN WITH MINERALS) TABS Take 1 tablet by mouth daily.    [provider]  oxyCODONE-acetaminophen (ROXICET) 5-325 MG tablet Take 1 tablet by mouth every 6 (six) hours as needed. 11/01/16 11/01/17  Cleon Gustin, MD  pioglitazone-metformin (ACTOPLUS MET) 202 680 3282 MG tablet Take 1 tablet by mouth 2 (two) times daily with a meal.    [provider]  tiZANidine (ZANAFLEX) 4 MG tablet Take 4 mg by mouth every 6 (six) hours as needed for muscle spasms.    [provider]  TRULICITY 1.5 PJ/0.3PR SOPN Inject 1.5 mg into the muscle every Saturday. 09/16/16   [provider]    Physical Exam: There were no vitals filed for this visit.    Constitutional: NAD, calm, comfortable. Obese.  Eyes: PERTLA, lids and conjunctivae normal ENMT: Mucous membranes are moist. Posterior pharynx clear of any exudate or lesions.   Neck: normal, supple, no  masses, no thyromegaly Respiratory: clear to auscultation bilaterally, no wheezing, no crackles. Normal respiratory effort.   Cardiovascular: S1 & S2 heard, regular rate and rhythm. No extremity edema. No significant JVD. Abdomen: No distension, no tenderness, no masses palpated. Bowel sounds normal.  Musculoskeletal: no clubbing / cyanosis. No joint deformity upper and lower extremities.   Skin: no significant rashes, lesions, ulcers. Warm, dry, well-perfused. Neurologic: CN 2-12 grossly intact. Sensation intact, DTR normal. Strength 5/5 in all 4 limbs.  Psychiatric: Alert and oriented x 3. Calm and cooperative.     Labs on Admission: I have personally reviewed following labs and imaging studies  CBC: No results for input(s): WBC, NEUTROABS, HGB, HCT, MCV, PLT in the last 168 hours. Basic Metabolic Panel: No results for input(s): NA, K, CL, CO2, GLUCOSE, BUN, CREATININE, CALCIUM, MG, PHOS in the last 168 hours. GFR: CrCl cannot be calculated (Patient's most recent lab result is older than the maximum 21 days allowed.). Liver Function Tests:  No results for input(s): AST, ALT, ALKPHOS, BILITOT, PROT, ALBUMIN in the last 168 hours. No results for input(s): LIPASE, AMYLASE in the last 168 hours. No results for input(s): AMMONIA in the last 168 hours. Coagulation Profile: No results for input(s): INR, PROTIME in the last 168 hours. Cardiac Enzymes: No results for input(s): CKTOTAL, CKMB, CKMBINDEX, TROPONINI in the last 168 hours. BNP (last 3 results) No results for input(s): PROBNP in the last 8760 hours. HbA1C: No results for input(s): HGBA1C in the last 72 hours. CBG: No results for input(s): GLUCAP in the last 168 hours. Lipid Profile: No results for input(s): CHOL, HDL, LDLCALC, TRIG, CHOLHDL, LDLDIRECT in the last 72 hours. Thyroid Function Tests: No results for input(s): TSH, T4TOTAL, FREET4, T3FREE, THYROIDAB in the last 72 hours. Anemia Panel: No results for input(s):  VITAMINB12, FOLATE, FERRITIN, TIBC, IRON, RETICCTPCT in the last 72 hours. Urine analysis: No results found for: COLORURINE, APPEARANCEUR, LABSPEC, PHURINE, GLUCOSEU, HGBUR, BILIRUBINUR, KETONESUR, PROTEINUR, UROBILINOGEN, NITRITE, LEUKOCYTESUR Sepsis Labs: @LABRCNTIP (procalcitonin:4,lacticidven:4) )No results found for this or any previous visit (from the past 240 hour(s)).   Radiological Exams on Admission: No results found.  EKG: Independently reviewed. Sinus rhythm, ventricular trigeminy, no ST-T abnormality.   Assessment/Plan  1. Chest pain  - Pt presents with acute-onset of chest pain while at rest  - No known hx of CAD, but has DM, HTN, and HLD  - Initial workup reassuring with negative troponin and no acute ischemic features on EKG   - He was treated with ASA 324 mg and 1" nitro ointment in ED; pain improved from a "9/10" to "2/10"  - Cardiology is consulting and much appreciated  - Plan to continue cardiac monitoring, obtain serial troponin measurements, repeat EKG, continue Lipitor, lisinopril, and daily ASA 81   2. Type II DM  - A1c was 6.6% in 2015   - Managed at home with metformin, pioglitazone, and Trulicity  - Check CBG F2T while NPO  - Start with a low-intensity Novolog sliding-scale and adjust prn    3. Hypertension with hypertensive urgency   - BP was 193/89 on arrival to ED, normalized after placement of 1" nitropaste  - Continue lisinopril    4. Hyperlipidemia  - In 2015, LDL was 78, HDL 41, and trigs 274  - Continue Lipitor     DVT prophylaxis: sq heparin  Code Status: Full  Family Communication: Family updated at bedside with patient's permission  Disposition Plan: Observe in SDU Consults called: Cardiology  Admission status: Observation    Vianne Bulls, MD [facility] Pager [phone]  If 7PM-7AM, please contact night-coverage [email or web address] Password [email or web address]  12/26/2016, 4:50 AM

## 2016-12-26 NOTE — Progress Notes (Signed)
    The patient understands that risks included but are not limited to stroke (1 in 1000), death (1 in 73), kidney failure [usually temporary] (1 in 500), bleeding (1 in 200), allergic reaction [possibly serious] (1 in 200).    Cath orders placed

## 2016-12-26 NOTE — Progress Notes (Signed)
   Samuel Pulling Sr. presented for a nuclear stress test today.  No immediate complications.  Stress imaging is pending at this time.  Preliminary EKG findings may be listed in the chart, but the stress test result will not be finalized until perfusion imaging is complete.  Linus Mako, PA-C 12/26/2016, 11:19 AM

## 2016-12-26 NOTE — Progress Notes (Signed)
myovue shows distal anterior wall apical reversible defect STS 6 PA to arrange cath tomorrow given chest pain admission And history  Jenkins Rouge

## 2016-12-26 NOTE — Consult Note (Signed)
Reason for Consult: chest pain   Requesting Physician/Service: [facility]   PCP:  Glenda Chroman, MD Primary Cardiologist: Branch  HPI:   56 year old male with DM, HTN, no known CAD presenting with chest pain and possible hypertensive urgency.  Had one previous presentation 2-3 years ago for chest pain symptoms, he was ruled out, and patient had negative stress test and echo.  Per notes, it may have been more GERD related then. Per him tonight, symptoms started today acutely while watching TV with his wife and he presented hypertensive to [facility] with worsening central pressure. He doesn't check his BP regularly at home.  His symptomse greatly improved after nitropaste and ECG in [place] and here is without ST depressions/elevation. His symptoms now are almost all gone, with mild residual chest pressure.  BP now 287 systolic.   He notes some qualifying features including recent heavy anxiety from his son moving into his house.  He has some mild worsening of his chest pain with palpation and improvement with respiration.  He notes mild LE edema recently but otherwise, no obvious prodrome of pain or decreased exercise capacity over the past few days to weeks.   He has a bladder tumor removed recently, no bleeding or bruising trouble.  Next urology appointment is in 1 year per his wife.  Previous Cardiac Studies: Lexiscan 05/10/2014  IMPRESSION: 1. No reversible ischemia or infarction.  2. Normal left ventricular wall motion.  3. Left ventricular ejection fraction 61%  4. Low-risk stress test findings*.  5. Stress EKG interpretation limited by heavy EKG artifact, no clear ischemia. Duke treadmill score of 8 consistent with low risk for major cardiac events.   Echo 05/10/14 Study Conclusions  - Left ventricle: The cavity size was normal. Wall thickness was normal. Systolic function was normal. The estimated ejection fraction was in the range of 60% to 65%. Indeterminant  diastolic function. Wall motion was normal; there were no regional wall motion abnormalities. - Aortic valve: Mildly calcified annulus. Trileaflet; mildly thickened leaflets. Valve area (VTI): 3.36 cm^2. Valve area (Vmax): 3.11 cm^2. Valve area (Vmean): 3.63 cm^2. - Mitral valve: Mildly calcified annulus. Mildly thickened leaflets . - Left atrium: The atrium was mildly dilated. - Right ventricle: The cavity size was mildly to moderately dilated. Systolic function was normal. RV TAPSE is 1.9 cm. - Right atrium: The atrium was mildly dilated. - Technically adequate study.  Past Medical History:  Diagnosis Date  . Chronic kidney disease    hx of stones  . Diabetes mellitus   . GERD (gastroesophageal reflux disease)   . Gout   . Gout   . History of kidney stones   . Hypertension   . Neuropathy     Past Surgical History:  Procedure Laterality Date  . BACK SURGERY     l4 anf l5  . CARPAL TUNNEL RELEASE     left  . COLONOSCOPY N/A 08/16/2013   Procedure: COLONOSCOPY;  Surgeon: Rogene Houston, MD;  [facility];  Service: Endoscopy;  Laterality: N/A;  930  . CYSTOSCOPY W/ RETROGRADES Bilateral 11/01/2016   Procedure: CYSTOSCOPY WITH RETROGRADE PYELOGRAM;  Surgeon: Cleon Gustin, MD;  Location: [facility];  Service: Urology;  Laterality: Bilateral;  . STONE EXTRACTION WITH BASKET  07/21/2011   Procedure: STONE EXTRACTION WITH BASKET;  Surgeon: Marissa Nestle, MD;  Location: [facility];  Service: Urology;  Laterality: Left;  . TRANSURETHRAL RESECTION OF BLADDER TUMOR N/A 11/01/2016   Procedure: TRANSURETHRAL  RESECTION OF BLADDER TUMOR (TURBT);  Surgeon: Cleon Gustin, MD;  Location: [facility];  Service: Urology;  Laterality: N/A;    Family History  Problem Relation Age of Onset  . Anesthesia problems Neg Hx   . Hypotension Neg Hx   . Malignant hyperthermia Neg Hx   . Pseudochol deficiency Neg Hx    Social History:  reports that he quit smoking about 19  years ago. His smoking use included Cigarettes. He has a 10.00 pack-year smoking history. His smokeless tobacco use includes Snuff. He reports that he drinks alcohol. He reports that he does not use drugs.  Allergies:  Allergies  Allergen Reactions  . Penicillins Anaphylaxis, Hives and Swelling    Has patient had a PCN reaction causing immediate rash, facial/tongue/throat swelling, SOB or lightheadedness with hypotension: Yes Has patient had a PCN reaction causing severe rash involving mucus membranes or skin necrosis: Yes Has patient had a PCN reaction that required hospitalization: No Has patient had a PCN reaction occurring within the last 10 years: No If all of the above answers are "NO", then may proceed with Cephalosporin use.     No current facility-administered medications on file prior to encounter.    Current Outpatient Prescriptions on File Prior to Encounter  Medication Sig Dispense Refill  . allopurinol (ZYLOPRIM) 300 MG tablet Take 300 mg by mouth daily.    Marland Kitchen aspirin EC 81 MG tablet Take 81 mg by mouth daily.    Marland Kitchen atorvastatin (LIPITOR) 40 MG tablet Take 1 tablet (40 mg total) by mouth daily at 6 PM. 30 tablet 1  . Bioflavonoid Products (ESTER-C) 1000-50 MG TABS Take 1 tablet by mouth daily.    Marland Kitchen HYDROcodone-acetaminophen (NORCO) 7.5-325 MG per tablet Take 0.5-1 tablets by mouth every 6 (six) hours as needed for moderate pain.    Marland Kitchen lisinopril (PRINIVIL,ZESTRIL) 10 MG tablet Take 10 mg by mouth daily.    . meloxicam (MOBIC) 15 MG tablet Take 15 mg by mouth daily.    . Multiple Vitamin (MULITIVITAMIN WITH MINERALS) TABS Take 1 tablet by mouth daily.    Marland Kitchen oxyCODONE-acetaminophen (ROXICET) 5-325 MG tablet Take 1 tablet by mouth every 6 (six) hours as needed. 30 tablet 0  . pioglitazone-metformin (ACTOPLUS MET) 15-850 MG tablet Take 1 tablet by mouth 2 (two) times daily with a meal.    . tiZANidine (ZANAFLEX) 4 MG tablet Take 4 mg by mouth every 6 (six) hours as needed for muscle  spasms.    . TRULICITY 1.5 FV/4.9SW SOPN Inject 1.5 mg into the muscle every Saturday.  11     Results for orders placed or performed during the hospital encounter of 12/26/16 (from the past 48 hour(s))  CBC WITH DIFFERENTIAL     Status: Abnormal   Collection Time: 12/26/16  4:56 AM  Result Value Ref Range   WBC 6.5 4.0 - 10.5 K/uL   RBC 4.09 (L) 4.22 - 5.81 MIL/uL   Hemoglobin 12.2 (L) 13.0 - 17.0 g/dL   HCT 37.2 (L) 39.0 - 52.0 %   MCV 91.0 78.0 - 100.0 fL   MCH 29.8 26.0 - 34.0 pg   MCHC 32.8 30.0 - 36.0 g/dL   RDW 14.2 11.5 - 15.5 %   Platelets 249 150 - 400 K/uL   Neutrophils Relative % 51 %   Neutro Abs 3.2 1.7 - 7.7 K/uL   Lymphocytes Relative 38 %   Lymphs Abs 2.5 0.7 - 4.0 K/uL   Monocytes Relative 8 %  Monocytes Absolute 0.5 0.1 - 1.0 K/uL   Eosinophils Relative 3 %   Eosinophils Absolute 0.2 0.0 - 0.7 K/uL   Basophils Relative 0 %   Basophils Absolute 0.0 0.0 - 0.1 K/uL  Glucose, capillary     Status: Abnormal   Collection Time: 12/26/16  5:01 AM  Result Value Ref Range   Glucose-Capillary 126 (H) 65 - 99 mg/dL   No results found.  ECG/TELE: NSR with sinus arrhythmia  ROS: As above. Otherwise, review of systems is negative unless per above HPI  Vitals:   12/26/16 0446 12/26/16 0520 12/26/16 0525  BP:  114/65 100/70  SpO2: 98% 98% 94%   Wt Readings from Last 10 Encounters:  05/05/14 125 kg (275 lb 9.6 oz)  08/16/13 120.2 kg (265 lb)  07/21/11 120.2 kg (265 lb)    PE:  General: No acute distress HEENT: Atraumatic, EOMI, mucous membranes moist. No JVD at 50 degrees. No HJR. CV: RRR no murmurs, gallops.  Respiratory: Clear, no crackles. Normal work of breathing ABD: Non-distended and non-tender. No palpable organomegaly.  Extremities: 2+ radial pulses bilaterally. Trace edema. Neuro/Psych: CN grossly intact, alert and oriented  Assessment/Plan Chest Pain HTN DM HLD  Unclear etiology of chest pain and original hypertensive presentation; patient  has some atypical features with family stress recently.  Will review troponin values as they return in the lab and if they remain negative, likely would favor stress testing if his symptoms continue to improve.   Chest pain - NPO as a precaution; timing of ischemic evaluation pending clinical course and labs which are drawn - S/P 325 ASA, continue 81 mg and can start IV UF Heparin if chest pain worsens or trop returns + - Tele bed - Continue atorva 40, lisinopril - Currently with nitropaste on and systolic BP 445; can wipe this off and titrate over to Imdur for anti-anginal therapy if his symptoms improve or IV NTG if symptoms do not improve   Lolita Cram Meesha Sek  MD 12/26/2016, 5:53 AM

## 2016-12-26 NOTE — Telephone Encounter (Signed)
Discussed case with Oval Linsey- 56 year old male with DM, HTN, no known CAD presenting with chest pain and possible hypertensive urgency.  Had one previous presentation 2-3 years ago for chest pain symptoms, he was ruled out, and patient didn't show up for outpatient cardiology visit for unclear reasons.  Per notes, it may have been more GERD related then.  Per ED tonight, symptoms started today and he presented hypertensive have greatly improved after nitropaste and ECG without ST depressions/elevation.  I advised that it was OK to start heparin if he still had chest pain prior to departure and recommneded hospitalist admission for hypertension/chest pain with cardiology consult.  Please page cardiology upon patient arrival to [facility] for assessment.  Zorita Pang, MD 1:17 AM

## 2016-12-27 ENCOUNTER — Encounter (HOSPITAL_COMMUNITY)
Admission: AD | Disposition: A | Discharge status: Home / Self Care | Payer: Self-pay | Source: Other Acute Inpatient Hospital | Attending: Internal Medicine

## 2016-12-27 DIAGNOSIS — R079 Chest pain, unspecified: Secondary | ICD-10-CM | POA: Diagnosis not present

## 2016-12-27 DIAGNOSIS — I200 Unstable angina: Secondary | ICD-10-CM

## 2016-12-27 DIAGNOSIS — I160 Hypertensive urgency: Secondary | ICD-10-CM | POA: Diagnosis not present

## 2016-12-27 DIAGNOSIS — E785 Hyperlipidemia, unspecified: Secondary | ICD-10-CM | POA: Diagnosis not present

## 2016-12-27 DIAGNOSIS — R9439 Abnormal result of other cardiovascular function study: Secondary | ICD-10-CM | POA: Diagnosis not present

## 2016-12-27 DIAGNOSIS — R0789 Other chest pain: Secondary | ICD-10-CM | POA: Diagnosis not present

## 2016-12-27 DIAGNOSIS — I10 Essential (primary) hypertension: Secondary | ICD-10-CM | POA: Diagnosis not present

## 2016-12-27 DIAGNOSIS — I129 Hypertensive chronic kidney disease with stage 1 through stage 4 chronic kidney disease, or unspecified chronic kidney disease: Secondary | ICD-10-CM | POA: Diagnosis not present

## 2016-12-27 DIAGNOSIS — E119 Type 2 diabetes mellitus without complications: Secondary | ICD-10-CM

## 2016-12-27 HISTORY — PX: LEFT HEART CATH AND CORONARY ANGIOGRAPHY: CATH118249

## 2016-12-27 LAB — GLUCOSE, CAPILLARY
GLUCOSE-CAPILLARY: 98 mg/dL (ref 65–99)
GLUCOSE-CAPILLARY: 168 mg/dL — AB (ref 65–99)
Glucose-Capillary: 121 mg/dL — ABNORMAL HIGH (ref 65–99)

## 2016-12-27 LAB — HEMOGLOBIN A1C
HEMOGLOBIN A1C: 7.1 % — AB (ref 4.8–5.6)
Mean Plasma Glucose: 157 mg/dL

## 2016-12-27 SURGERY — LEFT HEART CATH AND CORONARY ANGIOGRAPHY
Anesthesia: LOCAL

## 2016-12-27 MED ORDER — MORPHINE SULFATE (PF) 2 MG/ML IV SOLN: 2.0000 mg | INTRAVENOUS | Status: DC | PRN

## 2016-12-27 MED ORDER — VERAPAMIL HCL 2.5 MG/ML IV SOLN
INTRAVENOUS | Status: AC
  Filled 2016-12-27: qty 2

## 2016-12-27 MED ORDER — SODIUM CHLORIDE 0.9% FLUSH: 3.0000 mL | INTRAVENOUS | Status: DC | PRN

## 2016-12-27 MED ORDER — LIDOCAINE HCL (PF) 1 % IJ SOLN
INTRAMUSCULAR | Status: DC | PRN
  Administered 2016-12-27: 2 mL via INTRADERMAL

## 2016-12-27 MED ORDER — VERAPAMIL HCL 2.5 MG/ML IV SOLN
INTRA_ARTERIAL | Status: DC | PRN
  Administered 2016-12-27: 7.5 mL via INTRA_ARTERIAL

## 2016-12-27 MED ORDER — PIOGLITAZONE HCL-METFORMIN HCL 15-850 MG PO TABS: 1.0000 | ORAL_TABLET | Freq: Two times a day (BID) | ORAL | Status: AC

## 2016-12-27 MED ORDER — HEPARIN (PORCINE) IN NACL 2-0.9 UNIT/ML-% IJ SOLN
INTRAMUSCULAR | Status: AC
  Filled 2016-12-27: qty 1000

## 2016-12-27 MED ORDER — ACETAMINOPHEN 325 MG PO TABS: 650.0000 mg | ORAL_TABLET | ORAL | Status: DC | PRN

## 2016-12-27 MED ORDER — SODIUM CHLORIDE 0.9 % IV SOLN: 250.0000 mL | INTRAVENOUS | Status: DC | PRN

## 2016-12-27 MED ORDER — MIDAZOLAM HCL 2 MG/2ML IJ SOLN
INTRAMUSCULAR | Status: DC | PRN
  Administered 2016-12-27: 1 mg via INTRAVENOUS

## 2016-12-27 MED ORDER — LIDOCAINE HCL (PF) 1 % IJ SOLN
INTRAMUSCULAR | Status: AC
  Filled 2016-12-27: qty 30

## 2016-12-27 MED ORDER — ONDANSETRON HCL 4 MG/2ML IJ SOLN: 4.0000 mg | Freq: Four times a day (QID) | INTRAMUSCULAR | Status: DC | PRN

## 2016-12-27 MED ORDER — HEPARIN SODIUM (PORCINE) 1000 UNIT/ML IJ SOLN
INTRAMUSCULAR | Status: AC
  Filled 2016-12-27: qty 1

## 2016-12-27 MED ORDER — SODIUM CHLORIDE 0.9 % IV SOLN: INTRAVENOUS | Status: AC

## 2016-12-27 MED ORDER — MIDAZOLAM HCL 2 MG/2ML IJ SOLN
INTRAMUSCULAR | Status: AC
  Filled 2016-12-27: qty 2

## 2016-12-27 MED ORDER — FENTANYL CITRATE (PF) 100 MCG/2ML IJ SOLN
INTRAMUSCULAR | Status: DC | PRN
  Administered 2016-12-27: 25 ug via INTRAVENOUS

## 2016-12-27 MED ORDER — SODIUM CHLORIDE 0.9% FLUSH: 3.0000 mL | Freq: Two times a day (BID) | INTRAVENOUS | Status: DC

## 2016-12-27 MED ORDER — IOPAMIDOL (ISOVUE-370) INJECTION 76%
INTRAVENOUS | Status: DC | PRN
  Administered 2016-12-27: 80 mL via INTRA_ARTERIAL

## 2016-12-27 MED ORDER — IOPAMIDOL (ISOVUE-370) INJECTION 76%
INTRAVENOUS | Status: AC
  Filled 2016-12-27: qty 100

## 2016-12-27 MED ORDER — FENTANYL CITRATE (PF) 100 MCG/2ML IJ SOLN
INTRAMUSCULAR | Status: AC
  Filled 2016-12-27: qty 2

## 2016-12-27 MED ORDER — HEPARIN (PORCINE) IN NACL 2-0.9 UNIT/ML-% IJ SOLN
INTRAMUSCULAR | Status: AC | PRN
  Administered 2016-12-27: 1000 mL

## 2016-12-27 MED ORDER — NITROGLYCERIN 1 MG/10 ML FOR IR/CATH LAB
INTRA_ARTERIAL | Status: AC
  Filled 2016-12-27: qty 10

## 2016-12-27 SURGICAL SUPPLY — 13 items
CATH INFINITI 5FR ANG PIGTAIL (CATHETERS) ×2 IMPLANT
CATH OPTITORQUE TIG 4.0 5F (CATHETERS) ×2 IMPLANT
DEVICE RAD COMP TR BAND LRG (VASCULAR PRODUCTS) ×2 IMPLANT
GLIDESHEATH SLEND A-KIT 6F 22G (SHEATH) ×2 IMPLANT
GUIDEWIRE INQWIRE 1.5J.035X260 (WIRE) ×1 IMPLANT
HOVERMATT SINGLE USE (MISCELLANEOUS) ×2 IMPLANT
INQWIRE 1.5J .035X260CM (WIRE) ×2
KIT HEART LEFT (KITS) ×2 IMPLANT
PACK CARDIAC CATHETERIZATION (CUSTOM PROCEDURE TRAY) ×2 IMPLANT
SYR MEDRAD MARK V 150ML (SYRINGE) ×2 IMPLANT
TRANSDUCER W/STOPCOCK (MISCELLANEOUS) ×2 IMPLANT
TUBING CIL FLEX 10 FLL-RA (TUBING) ×2 IMPLANT
WIRE HI TORQ VERSACORE-J 145CM (WIRE) ×2 IMPLANT

## 2016-12-27 NOTE — Progress Notes (Signed)
Good news with cath, no need for cardiology follow up.

## 2016-12-27 NOTE — H&P (View-Only) (Signed)
Progress Note  Patient Name: Samuel Burroughs Sr. Date of Encounter: 12/27/2016  Primary Cardiologist: Branch  Subjective   56 yo with poorly controlled DM, HTN  Presented with CP troponins are negative Scheduled for cath today    Inpatient Medications    Scheduled Meds: . allopurinol  300 mg Oral Daily  . aspirin EC  81 mg Oral Daily  . atorvastatin  40 mg Oral q1800  . heparin  5,000 Units Subcutaneous Q8H  . insulin aspart  0-9 Units Subcutaneous TID WC  . lisinopril  10 mg Oral Daily  . multivitamin with minerals  1 tablet Oral Daily  . pantoprazole  40 mg Oral Daily  . sodium chloride flush  3 mL Intravenous Q12H   Continuous Infusions: . sodium chloride    . sodium chloride 1 mL/kg/hr (12/27/16 1048)   PRN Meds: sodium chloride, acetaminophen, metoprolol tartrate, morphine injection, nitroGLYCERIN, ondansetron (ZOFRAN) IV, oxyCODONE-acetaminophen, sodium chloride flush, tiZANidine   Vital Signs    Vitals:   12/27/16 0103 12/27/16 0641 12/27/16 0746 12/27/16 1227  BP: 109/63 113/63 112/72 111/68  Pulse: 71 76 62 74  Resp: 18 18    Temp: 97.7 F (36.5 C) 98 F (36.7 C) 98.2 F (36.8 C)   TempSrc: Oral Oral Oral Oral  SpO2: 98% 100% 97% 99%  Weight:      Height:        Intake/Output Summary (Last 24 hours) at 12/27/16 1324 Last data filed at 12/27/16 0852  Gross per 24 hour  Intake           534.72 ml  Output                0 ml  Net           534.72 ml   Filed Weights   12/26/16 0430  Weight: 280 lb 8 oz (127.2 kg)    Telemetry    NSR  - Personally Reviewed  ECG     NSR  - Personally Reviewed  Physical Exam   GEN: No acute distress.   Neck: No JVD Cardiac: RRR, no murmurs, rubs, or gallops.  Respiratory: Clear to auscultation bilaterally. GI: Soft, nontender, non-distended  MS: No edema; No deformity. Neuro:  Nonfocal  Psych: Normal affect   Labs    Chemistry Recent Labs Lab 12/26/16 0456  NA 140  K 4.1  CL 107  CO2  28  GLUCOSE 134*  BUN 10  CREATININE 0.75  CALCIUM 8.6*  PROT 6.4*  ALBUMIN 3.4*  AST 20  ALT 22  ALKPHOS 63  BILITOT 0.7  GFRNONAA >60  GFRAA >60  ANIONGAP 5     Hematology Recent Labs Lab 12/26/16 0456  WBC 6.5  RBC 4.09*  HGB 12.2*  HCT 37.2*  MCV 91.0  MCH 29.8  MCHC 32.8  RDW 14.2  PLT 249    Cardiac Enzymes Recent Labs Lab 12/26/16 0456  TROPONINI <0.03   No results for input(s): TROPIPOC in the last 168 hours.   BNPNo results for input(s): BNP, PROBNP in the last 168 hours.   DDimer No results for input(s): DDIMER in the last 168 hours.   Radiology    Dg Chest 2 View  Result Date: 12/26/2016 CLINICAL DATA:  Chest pain. EXAM: CHEST  2 VIEW COMPARISON:  12/25/2016 FINDINGS: The heart size and mediastinal contours are within normal limits. Both lungs are clear. The visualized skeletal structures are unremarkable. IMPRESSION: No active cardiopulmonary disease. Electronically Signed  By: Kerby Moors M.D.   On: 12/26/2016 08:05   Nm Myocar Multi W/spect W/wall Motion / Ef  Result Date: 12/26/2016 CLINICAL DATA:  Chest pain.  Smoker.  Hypertension. EXAM: MYOCARDIAL IMAGING WITH SPECT (REST AND EXERCISE) GATED LEFT VENTRICULAR WALL MOTION STUDY LEFT VENTRICULAR EJECTION FRACTION TECHNIQUE: Standard myocardial SPECT imaging was performed after resting intravenous injection of 10 mCi Tc-16m tetrofosmin. Subsequently, exercise tolerance test was performed by the patient under the supervision of the Cardiology staff. At peak-stress, 30 mCi Tc-45m tetrofosmin was injected intravenously and standard myocardial SPECT imaging was performed. Quantitative gated imaging was also performed to evaluate left ventricular wall motion, and estimate left ventricular ejection fraction. COMPARISON:  None. FINDINGS: Perfusion: There is a medium size focus of mild reversibility involving the apex. Wall Motion: Normal left ventricular wall motion. No left ventricular dilation. Left  Ventricular Ejection Fraction: 55 % End diastolic volume 400 ml End systolic volume 72 ml IMPRESSION: 1. Medium size focus of mild reversibility involves the left apex. 2. Normal left ventricular wall motion. 3. Left ventricular ejection fraction 55% 4. Non invasive risk stratification*: Intermediate *2012 Appropriate Use Criteria for Coronary Revascularization Focused Update: J Am Coll Cardiol. 8676;19(5):093-267. http://content.[email or web address]?articleid=[email or web address] These results will be called to the ordering clinician or representative by the Radiologist Assistant, and communication documented in the PACS or zVision Dashboard. Electronically Signed   By: Kerby Moors M.D.   On: 12/26/2016 13:01    Cardiac Studies      Patient Profile     56 y.o. male with DM, HTN. Presented with CP   Assessment & Plan    1.  Chest pain :   troponins are negative.  For cath today    Signed, Mertie Moores, MD  12/27/2016, 1:24 PM

## 2016-12-27 NOTE — Interval H&P Note (Signed)
Cath Lab Visit (complete for each Cath Lab visit)  Clinical Evaluation Leading to the Procedure:   ACS: Yes.    Non-ACS:    Anginal Classification: CCS III  Anti-ischemic medical therapy: No Therapy  Non-Invasive Test Results: Intermediate-risk stress test findings: cardiac mortality 1-3%/year  Prior CABG: No previous CABG      History and Physical Interval Note:  12/27/2016 3:16 PM  Samuel Pulling Sr.  has presented today for surgery, with the diagnosis of unstable angina  The various methods of treatment have been discussed with the patient and family. After consideration of risks, benefits and other options for treatment, the patient has consented to  Procedure(s): Left Heart Cath and Coronary Angiography (N/A) as a surgical intervention .  The patient's history has been reviewed, patient examined, no change in status, stable for surgery.  I have reviewed the patient's chart and labs.  Questions were answered to the patient's satisfaction.     Quay Burow

## 2016-12-27 NOTE — Progress Notes (Signed)
Progress Note  Patient Name: Samuel Held Sr. Date of Encounter: 12/27/2016  Primary Cardiologist: Branch  Subjective   56 yo with poorly controlled DM, HTN  Presented with CP troponins are negative Scheduled for cath today    Inpatient Medications    Scheduled Meds: . allopurinol  300 mg Oral Daily  . aspirin EC  81 mg Oral Daily  . atorvastatin  40 mg Oral q1800  . heparin  5,000 Units Subcutaneous Q8H  . insulin aspart  0-9 Units Subcutaneous TID WC  . lisinopril  10 mg Oral Daily  . multivitamin with minerals  1 tablet Oral Daily  . pantoprazole  40 mg Oral Daily  . sodium chloride flush  3 mL Intravenous Q12H   Continuous Infusions: . sodium chloride    . sodium chloride 1 mL/kg/hr (12/27/16 1048)   PRN Meds: sodium chloride, acetaminophen, metoprolol tartrate, morphine injection, nitroGLYCERIN, ondansetron (ZOFRAN) IV, oxyCODONE-acetaminophen, sodium chloride flush, tiZANidine   Vital Signs    Vitals:   12/27/16 0103 12/27/16 0641 12/27/16 0746 12/27/16 1227  BP: 109/63 113/63 112/72 111/68  Pulse: 71 76 62 74  Resp: 18 18    Temp: 97.7 F (36.5 C) 98 F (36.7 C) 98.2 F (36.8 C)   TempSrc: Oral Oral Oral Oral  SpO2: 98% 100% 97% 99%  Weight:      Height:        Intake/Output Summary (Last 24 hours) at 12/27/16 1324 Last data filed at 12/27/16 0852  Gross per 24 hour  Intake           534.72 ml  Output                0 ml  Net           534.72 ml   Filed Weights   12/26/16 0430  Weight: 280 lb 8 oz (127.2 kg)    Telemetry    NSR  - Personally Reviewed  ECG     NSR  - Personally Reviewed  Physical Exam   GEN: No acute distress.   Neck: No JVD Cardiac: RRR, no murmurs, rubs, or gallops.  Respiratory: Clear to auscultation bilaterally. GI: Soft, nontender, non-distended  MS: No edema; No deformity. Neuro:  Nonfocal  Psych: Normal affect   Labs    Chemistry Recent Labs Lab 12/26/16 0456  NA 140  K 4.1  CL 107  CO2  28  GLUCOSE 134*  BUN 10  CREATININE 0.75  CALCIUM 8.6*  PROT 6.4*  ALBUMIN 3.4*  AST 20  ALT 22  ALKPHOS 63  BILITOT 0.7  GFRNONAA >60  GFRAA >60  ANIONGAP 5     Hematology Recent Labs Lab 12/26/16 0456  WBC 6.5  RBC 4.09*  HGB 12.2*  HCT 37.2*  MCV 91.0  MCH 29.8  MCHC 32.8  RDW 14.2  PLT 249    Cardiac Enzymes Recent Labs Lab 12/26/16 0456  TROPONINI <0.03   No results for input(s): TROPIPOC in the last 168 hours.   BNPNo results for input(s): BNP, PROBNP in the last 168 hours.   DDimer No results for input(s): DDIMER in the last 168 hours.   Radiology    Dg Chest 2 View  Result Date: 12/26/2016 CLINICAL DATA:  Chest pain. EXAM: CHEST  2 VIEW COMPARISON:  12/25/2016 FINDINGS: The heart size and mediastinal contours are within normal limits. Both lungs are clear. The visualized skeletal structures are unremarkable. IMPRESSION: No active cardiopulmonary disease. Electronically Signed  By: Kerby Moors M.D.   On: 12/26/2016 08:05   Nm Myocar Multi W/spect W/wall Motion / Ef  Result Date: 12/26/2016 CLINICAL DATA:  Chest pain.  Smoker.  Hypertension. EXAM: MYOCARDIAL IMAGING WITH SPECT (REST AND EXERCISE) GATED LEFT VENTRICULAR WALL MOTION STUDY LEFT VENTRICULAR EJECTION FRACTION TECHNIQUE: Standard myocardial SPECT imaging was performed after resting intravenous injection of 10 mCi Tc-60m tetrofosmin. Subsequently, exercise tolerance test was performed by the patient under the supervision of the Cardiology staff. At peak-stress, 30 mCi Tc-3m tetrofosmin was injected intravenously and standard myocardial SPECT imaging was performed. Quantitative gated imaging was also performed to evaluate left ventricular wall motion, and estimate left ventricular ejection fraction. COMPARISON:  None. FINDINGS: Perfusion: There is a medium size focus of mild reversibility involving the apex. Wall Motion: Normal left ventricular wall motion. No left ventricular dilation. Left  Ventricular Ejection Fraction: 55 % End diastolic volume 124 ml End systolic volume 72 ml IMPRESSION: 1. Medium size focus of mild reversibility involves the left apex. 2. Normal left ventricular wall motion. 3. Left ventricular ejection fraction 55% 4. Non invasive risk stratification*: Intermediate *2012 Appropriate Use Criteria for Coronary Revascularization Focused Update: J Am Coll Cardiol. 5809;98(3):382-505. http://content.[email or web address].aspx?articleid=[email or web address] These results will be called to the ordering clinician or representative by the Radiologist Assistant, and communication documented in the PACS or zVision Dashboard. Electronically Signed   By: Kerby Moors M.D.   On: 12/26/2016 13:01    Cardiac Studies      Patient Profile     56 y.o. male with DM, HTN. Presented with CP   Assessment & Plan    1.  Chest pain :   troponins are negative.  For cath today    Signed, Mertie Moores, MD  12/27/2016, 1:24 PM

## 2016-12-27 NOTE — Discharge Summary (Signed)
Physician Discharge Summary  Samuel Caldwell. MRN:[id number] DOB: 10/31/1960  PCP: Vyas, Dhruv B, MD  Admit date: 12/26/2016 Discharge date: 12/27/2016  Recommendations for Outpatient Follow-up:  1. Dr. Dhruv Vyas, PCP in 1 week.  Home Health: None Equipment/Devices: None    Discharge Condition: Improved and stable.  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 Reported stress test approximately 4 years ago is normal. No personal or family history of VTE. Cardiology consulted. Stress test: Intermediate risk. Hence underwent Cardiac cath which showed normal coronaries.   Assessment & Plan:   1. Chest pain with abnormal Myoview: EKG without acute changes. Troponin 1 negative. Cardiology consulted. Exercise Myoview was abnormal/intermediate risk. As per cardiology, distal anterior wall apical reversible defect. Plan for cardiac cath 7/30. Continue aspirin, statins. No recurrence of chest pain. Underwent Cardiac cath which showed normal coronaries.  Cardiology cleared for DC home without need to follow up with them. 2. Type II DM: Held oral hypoglycemics. Reasonable inpatient control on SSI. Hemoglobin A1c: 7.1. Resume Actoplus Met 48 after contrast for cardiac cath. 3. Essential hypertension with hypertensive urgency: Presented with BP of 193/89. Now controlled. Continue lisinopril. 4. Hyperlipidemia: Continue statins. 5. Morbid obesity/Body mass index is 41.42 kg/m. Needs to lose weight. 6. Social issues: Stressed by  Son's divorce and plans to move in with his parents with 2 small kids. Reassured.    Consultants:  Cardiology   Procedures:  Cardiac cath 12/27/2016:   Procedures   Left Heart Cath and Coronary Angiography  Conclusion     The left ventricular systolic function is normal.  LV end diastolic pressure is normal.  The left ventricular ejection fraction is 55-65% by visual estimate.    IMPRESSION: Mr. Cremeans has normal coronary arteries and normal LV function. I believe his chest pain was noncardiac and his Myoview false positive. Medical therapy will be recommended. The sheath was removed and a TR band was placed on the right wrist to achieve patent hemostasis. The patient left the lab in stable condition.   Discharge Instructions  Discharge Instructions    Call MD for:  difficulty breathing, headache or visual disturbances    Complete by:  As directed    Call MD for:  extreme fatigue    Complete by:  As directed    Call MD for:  persistant dizziness or light-headedness    Complete by:  As directed    Call MD for:  severe uncontrolled pain    Complete by:  As directed    Diet - low sodium heart healthy    Complete by:  As directed    Diet Carb Modified    Complete by:  As directed    Increase activity slowly    Complete by:  As directed        Medication List    STOP taking these medications   oxyCODONE-acetaminophen 5-325 MG tablet Commonly known as:  ROXICET     TAKE these medications   allopurinol 300 MG tablet Commonly known as:  ZYLOPRIM Take 300   mg by mouth daily.   aspirin EC 81 MG tablet Take 81 mg by mouth daily.   atorvastatin 40 MG tablet Commonly known as:  LIPITOR Take 1 tablet (40 mg total) by mouth daily at 6 PM.   ESTER-C 1000-50 MG Tabs Take 1 tablet by mouth daily.   HYDROcodone-acetaminophen 7.5-325 MG tablet Commonly known as:  NORCO Take 0.5-1 tablets by mouth every 6 (six) hours as needed for moderate pain.   lansoprazole  30 MG capsule Commonly known as:  PREVACID Take 30 mg by mouth daily at 12 noon.   lisinopril 10 MG tablet Commonly known as:  PRINIVIL,ZESTRIL Take 10 mg by mouth daily.   meloxicam 15 MG tablet Commonly known as:  MOBIC Take 15 mg by mouth daily.   multivitamin with minerals Tabs tablet Take 1 tablet by mouth daily.   pioglitazone-metformin 15-850 MG tablet Commonly known as:  ACTOPLUS MET Take 1 tablet by mouth 2 (two) times daily with a meal. Start taking on:  12/30/2016 What changed:  These instructions start on 12/30/2016. If you are unsure what to do until then, ask your doctor or other care provider.   tiZANidine 4 MG tablet Commonly known as:  ZANAFLEX Take 4 mg by mouth every 6 (six) hours as needed for muscle spasms.   TRULICITY 1.5 MG/0.5ML Sopn Generic drug:  Dulaglutide Inject 1.5 mg into the muscle every Saturday.      Follow-up Information    Vyas, Dhruv B, MD. Schedule an appointment as soon as possible for a visit in 1 week(s).   Specialty:  Internal Medicine Contact information: [address] [place] [address] [phone]          Allergies  Allergen Reactions  . Penicillins Anaphylaxis, Hives and Swelling    Has patient had a PCN reaction causing immediate rash, facial/tongue/throat swelling, SOB or lightheadedness with hypotension: Yes Has patient had a PCN reaction causing severe rash involving mucus membranes or skin necrosis: Yes Has patient had a PCN reaction that required hospitalization: No Has patient had a PCN reaction occurring within the last 10 years: No If all of the above answers are "NO", then may proceed with Cephalosporin use.       Procedures/Studies: Dg Chest 2 View  Result Date: 12/26/2016 CLINICAL DATA:  Chest pain. EXAM: CHEST  2 VIEW COMPARISON:  12/25/2016 FINDINGS: The heart size and mediastinal contours are within normal limits. Both lungs are clear. The visualized skeletal structures are unremarkable. IMPRESSION: No  active cardiopulmonary disease. Electronically Signed   By: Taylor  Stroud M.D.   On: 12/26/2016 08:05   Nm Myocar Multi W/spect W/wall Motion / Ef  Result Date: 12/26/2016 CLINICAL DATA:  Chest pain.  Smoker.  Hypertension. EXAM: MYOCARDIAL IMAGING WITH SPECT (REST AND EXERCISE) GATED LEFT VENTRICULAR WALL MOTION STUDY LEFT VENTRICULAR EJECTION FRACTION TECHNIQUE: Standard myocardial SPECT imaging was performed after resting intravenous injection of 10 mCi Tc-99m tetrofosmin. Subsequently, exercise tolerance test was performed by the patient under the supervision of the Cardiology staff. At peak-stress, 30 mCi Tc-99m tetrofosmin was injected intravenously and standard myocardial SPECT imaging was performed. Quantitative gated imaging was also performed to evaluate left ventricular wall motion, and estimate left ventricular ejection fraction. COMPARISON:  None. FINDINGS: Perfusion: There is a medium size focus of mild reversibility involving the apex. Wall Motion: Normal left ventricular wall motion. No left ventricular dilation. Left Ventricular Ejection Fraction: 55 % End diastolic volume 158 ml End systolic volume 72 ml IMPRESSION: 1. Medium   size focus of mild reversibility involves the left apex. 2. Normal left ventricular wall motion. 3. Left ventricular ejection fraction 55% 4. Non invasive risk stratification*: Intermediate *2012 Appropriate Use Criteria for Coronary Revascularization Focused Update: J Am Coll Cardiol. 0175;10(2):585-277. http://content.[email or web address].aspx?articleid=[email or web address] These results will be called to the ordering clinician or representative by the Radiologist Assistant, and communication documented in the PACS or zVision Dashboard. Electronically Signed   By: Kerby Moors M.D.   On: 12/26/2016 13:01      Subjective: Seen this morning before cath. No recurrence of chest pain. Denied any other complaints.  Discharge Exam:  Vitals:   12/27/16 1524 12/27/16 1528  12/27/16 1533 12/27/16 1713  BP: 121/78 121/71 116/78   Pulse: (!) 56 61 83   Resp: _0 Temp:    (!) 97.5 F (36.4 C)  TempSrc:    Oral  SpO2: 99% 99% 100%   Weight:      Height:        General exam: Pleasant young male, moderately built and obese, lying comfortably propped up in bed. Respiratory system: Clear to auscultation. Respiratory effort normal.Stable. Cardiovascular system: S1 & S2 heard, RRR. No JVD, murmurs, rubs, gallops or clicks. No pedal edema. Telemetry personally reviewed: Sinus bradycardia in the 50s. Occasional trigeminy and PVCs. Stable. Gastrointestinal system: Abdomen is nondistended, soft and nontender. No organomegaly or masses felt. Normal bowel sounds heard. Stable Central nervous system: Alert and oriented. No focal neurological deficits. Stable Extremities: Symmetric 5 x 5 power. Skin: No rashes, lesions or ulcers Psychiatry: Judgement and insight appear normal. Mood & affect appropriate.     The results of significant diagnostics from this hospitalization (including imaging, microbiology, ancillary and laboratory) are listed below for reference.     Microbiology: Recent Results (from the past 240 hour(s))  MRSA PCR Screening     Status: None   Collection Time: 12/26/16  4:53 AM  Result Value Ref Range Status   MRSA by PCR NEGATIVE NEGATIVE Final    Comment:        The GeneXpert MRSA Assay (FDA approved for NASAL specimens only), is one component of a comprehensive MRSA colonization surveillance program. It is not intended to diagnose MRSA infection nor to guide or monitor treatment for MRSA infections.      Labs: CBC:  Recent Labs Lab 12/26/16 0456  WBC 6.5  NEUTROABS 3.2  HGB 12.2*  HCT 37.2*  MCV 91.0  PLT 824   Basic Metabolic Panel:  Recent Labs Lab 12/26/16 0456  NA 140  K 4.1  CL 107  CO2 28  GLUCOSE 134*  BUN 10  CREATININE 0.75  CALCIUM 8.6*   Liver Function Tests:  Recent Labs Lab 12/26/16 0456   AST 20  ALT 22  ALKPHOS 63  BILITOT 0.7  PROT 6.4*  ALBUMIN 3.4*   Cardiac Enzymes:  Recent Labs Lab 12/26/16 0456  TROPONINI <0.03   CBG:  Recent Labs Lab 12/26/16 1622 12/26/16 2014 12/27/16 0744 12/27/16 1132 12/27/16 1709  GLUCAP 130* 126* 121* 98 168*   Hgb A1c  Recent Labs  12/26/16 0456  HGBA1C 7.1*      Time coordinating discharge: Over 30 minutes  SIGNED:  Vernell Leep, MD, FACP, [place]. [facility] Pager [phone]  If 7PM-7AM, please contact night-coverage [email or web address] Password [facility] 12/27/2016, 7:37 PM

## 2016-12-27 NOTE — Progress Notes (Signed)
PROGRESS NOTE   Samuel Poer Sr.  YQI:[id number]    DOB: 10-Mar-1961    DOA: 12/26/2016  PCP: Glenda Chroman, MD   I have briefly reviewed patients previous medical records in [facility].  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 Reported stress test approximately 4 years ago is normal. No personal or family history of VTE. Cardiology consulted. Stress test: Intermediate risk. As per cardiology, plan for cardiac cath 7/30.   Assessment & Plan:   Principal Problem:   Chest pain Active Problems:   Diabetes mellitus (HCC)   HTN (hypertension)   Hyperlipidemia   Hypertensive urgency   1. Chest pain with abnormal Myoview: EKG without acute changes. Troponin 1 negative. Cardiology consulted. Exercise Myoview was abnormal/intermediate risk. As per cardiology, distal anterior wall apical reversible defect. Plan for cardiac cath 7/30. Continue aspirin, statins. No recurrence of chest pain. Seen this morning prior to cath and further management and recommendations based on that. 2. Type II DM: Hold oral hypoglycemics. Reasonable inpatient control on SSI. Hemoglobin A1c: 7.1. 3. Essential hypertension with hypertensive urgency: Presented with BP of 193/89. Now controlled. Continue lisinopril. Consider beta blockers. 4. Hyperlipidemia: Continue statins. 5. Morbid obesity/Body mass index is 41.42 kg/m. Needs to lose weight. 6. Social issues: Stressed by [employer] divorce and plans to move in with his parents with 2 small kids.   DVT prophylaxis: Subcutaneous heparin Code Status: Full Family Communication: Discussed in detail with patient's spouse at bedside. Disposition: To be determined pending further cardiac workup.   Consultants:  Cardiology   Procedures:   None  Antimicrobials:  None    Subjective: Seen this morning prior to cardiac cath. No recurrence of chest pain. Denies any other complaints.  ROS: Denies.  Objective:  Vitals:   12/26/16 2012 12/27/16 0103 12/27/16 0641 12/27/16 0746  BP: 116/82 109/63 113/63 112/72  Pulse: (!) 51 71 76 62  Resp: 18 18 18    Temp: (!) 97.3 F (36.3 C) 97.7 F (36.5 C) 98 F (36.7 C) 98.2 F (36.8 C)  TempSrc: Oral Oral Oral Oral  SpO2: 99% 98% 100% 97%  Weight:      Height:        Examination:  General exam: Pleasant young male, moderately built and obese, lying comfortably propped up in bed. Respiratory system: Clear to auscultation. Respiratory effort normal.Stable. Cardiovascular system: S1 & S2 heard, RRR. No JVD, murmurs, rubs, gallops or clicks. No pedal edema. Telemetry personally reviewed: Sinus bradycardia in the 50s. Occasional trigeminy and PVCs. Stable. Gastrointestinal system: Abdomen is nondistended, soft and nontender. No organomegaly or masses felt. Normal bowel sounds heard. Stable Central nervous system: Alert and oriented. No focal neurological deficits. Stable Extremities: Symmetric 5 x 5 power. Skin: No rashes, lesions or ulcers Psychiatry: Judgement and insight appear normal. Mood & affect appropriate.     Data Reviewed: I have personally reviewed following labs and imaging studies  CBC:  Recent Labs Lab 12/26/16 0456  WBC 6.5  NEUTROABS 3.2  HGB 12.2*  HCT 37.2*  MCV 91.0  PLT 387   Basic Metabolic Panel:  Recent Labs Lab 12/26/16 0456  NA 140  K 4.1  CL 107  CO2 28  GLUCOSE 134*  BUN 10  CREATININE 0.75  CALCIUM 8.6*   Liver Function Tests:  Recent Labs Lab 12/26/16 0456  AST 20  ALT 22  ALKPHOS 63  BILITOT 0.7  PROT 6.4*  ALBUMIN 3.4*   Coagulation Profile:  Recent Labs Lab 12/26/16 2044  INR 1.13   Cardiac Enzymes:  Recent Labs Lab 12/26/16 0456  TROPONINI <0.03   HbA1C:  Recent Labs  12/26/16 0456  HGBA1C  7.1*   CBG:  Recent Labs Lab 12/26/16 0750 12/26/16 1141 12/26/16 1622 12/26/16 2014 12/27/16 0744  GLUCAP 131* 137* 130* 126* 121*    Recent Results (from the past 240 hour(s))  MRSA PCR Screening     Status: None   Collection Time: 12/26/16  4:53 AM  Result Value Ref Range Status   MRSA by PCR NEGATIVE NEGATIVE Final    Comment:        The GeneXpert MRSA Assay (FDA approved for NASAL specimens only), is one component of a comprehensive MRSA colonization surveillance program. It is not intended to diagnose MRSA infection nor to guide or monitor treatment for MRSA infections.          Radiology Studies: Dg Chest 2 View  Result Date: 12/26/2016 CLINICAL DATA:  Chest pain. EXAM: CHEST  2 VIEW COMPARISON:  12/25/2016 FINDINGS: The heart size and mediastinal contours are within normal limits. Both lungs are clear. The visualized skeletal structures are unremarkable. IMPRESSION: No active cardiopulmonary disease. Electronically Signed   By: Kerby Moors M.D.   On: 12/26/2016 08:05   Nm Myocar Multi W/spect W/wall Motion / Ef  Result Date: 12/26/2016 CLINICAL DATA:  Chest pain.  Smoker.  Hypertension. EXAM: MYOCARDIAL IMAGING WITH SPECT (REST AND EXERCISE) GATED LEFT VENTRICULAR WALL MOTION STUDY LEFT VENTRICULAR EJECTION FRACTION TECHNIQUE: Standard myocardial SPECT imaging was performed after resting intravenous injection of 10 mCi Tc-49m tetrofosmin. Subsequently, exercise tolerance test was performed by the patient under the supervision of the Cardiology staff. At peak-stress, 30 mCi Tc-58m tetrofosmin was injected intravenously and standard myocardial SPECT imaging was performed. Quantitative gated imaging was also performed to evaluate left ventricular wall motion, and estimate left ventricular ejection fraction. COMPARISON:  None. FINDINGS: Perfusion: There is a medium size focus of mild reversibility involving the apex. Wall Motion: Normal left ventricular wall motion.  No left ventricular dilation. Left Ventricular Ejection Fraction: 55 % End diastolic volume 564 ml End systolic volume 72 ml IMPRESSION: 1. Medium size focus of mild reversibility involves the left apex. 2. Normal left ventricular wall motion. 3. Left ventricular ejection fraction 55% 4. Non invasive risk stratification*: Intermediate *2012 Appropriate Use Criteria for Coronary Revascularization Focused Update: J Am Coll Cardiol. 3329;51(8):841-660. http://content.[email or web address]?[email or web address] These results will be called to the ordering clinician or representative by the Radiologist Assistant, and communication documented in the PACS or zVision Dashboard. Electronically Signed   By: Kerby Moors M.D.   On: 12/26/2016 13:01        Scheduled Meds: . allopurinol  300 mg Oral Daily  . aspirin EC  81 mg Oral Daily  . atorvastatin  40 mg Oral q1800  . heparin  5,000 Units Subcutaneous Q8H  . insulin aspart  0-9 Units Subcutaneous TID WC  . lisinopril  10 mg Oral Daily  . multivitamin with minerals  1 tablet Oral Daily  . pantoprazole  40 mg Oral Daily  . sodium chloride flush  3 mL Intravenous Q12H   Continuous Infusions: . sodium chloride    . sodium chloride 1 mL/kg/hr (12/27/16 1048)     LOS: 0 days     Haydon Dorris, MD,  FACP, FHM. [facility] Pager [phone]  If 7PM-7AM, please contact night-coverage [email or web address] Password [email or web address] 12/27/2016, 11:04 AM

## 2016-12-28 ENCOUNTER — Encounter (HOSPITAL_COMMUNITY): Payer: Self-pay | Admitting: Cardiovascular Disease

## 2016-12-28 MED FILL — Heparin Sodium (Porcine) Inj 1000 Unit/ML: INTRAMUSCULAR | Qty: 10 | Status: AC

## 2017-07-11 ENCOUNTER — Ambulatory Visit (INDEPENDENT_AMBULATORY_CARE_PROVIDER_SITE_OTHER): Payer: BLUE CROSS/BLUE SHIELD | Admitting: Internal Medicine

## 2017-07-11 ENCOUNTER — Encounter (INDEPENDENT_AMBULATORY_CARE_PROVIDER_SITE_OTHER): Payer: Self-pay | Admitting: *Deleted

## 2017-07-11 ENCOUNTER — Telehealth (INDEPENDENT_AMBULATORY_CARE_PROVIDER_SITE_OTHER): Payer: Self-pay | Admitting: *Deleted

## 2017-07-11 ENCOUNTER — Encounter (INDEPENDENT_AMBULATORY_CARE_PROVIDER_SITE_OTHER): Payer: Self-pay | Admitting: Internal Medicine

## 2017-07-11 VITALS — BP 180/72 | HR 72 | Temp 98.0°F | Ht 69.5 in | Wt 277.4 lb

## 2017-07-11 DIAGNOSIS — R195 Other fecal abnormalities: Secondary | ICD-10-CM

## 2017-07-11 MED ORDER — PEG 3350-KCL-NA BICARB-NACL 420 G PO SOLR: 4000.0000 mL | Freq: Once | ORAL | 0 refills | Status: DC

## 2017-07-11 NOTE — Progress Notes (Signed)
Subjective:    Patient ID: Samuel Pulling Sr., male    DOB: 1960/10/02, 57 y.o.   MRN: [id number]  HPI Referred by Dr. Woody Seller for positive stool card. He occasionally sees blood in his stools. Has seen blood when he wipes.  He sometimes will skip a day when having a BM. Has a lot of flatus. Appetite is good. No unintentional weight.  Says he has no injury.  Last colonoscopy in 2015.   06/04/2017 H and H 13.0 and 40.0 Last colonoscopy was in 2015 (average risk). Dr. Laural Golden:  Prep excellent. Single diverticulum at cecum along with few more small ones at sigmoid colon. Redundant fold with mucosal erythema at sigmoid colon. Biopsy taken. Normal rectal mucosa. Small hemorrhoids below the dentate line. Biopsy sessile serrated polyps. Next colonoscopy 5 yrs.  Diabetic since 1999 Hx of hypertension and high cholesterol   . Past Medical History:  Diagnosis Date  . Chronic kidney disease    hx of stones  . Diabetes mellitus   . GERD (gastroesophageal reflux disease)   . Gout   . Gout   . History of kidney stones   . Hypertension   . Neuropathy     Past Surgical History:  Procedure Laterality Date  . BACK SURGERY     l4 anf l5  . CARPAL TUNNEL RELEASE     left  . COLONOSCOPY N/A 08/16/2013   Procedure: COLONOSCOPY;  Surgeon: Rogene Houston, MD;  Location: [facility];  Service: Endoscopy;  Laterality: N/A;  930  . CYSTOSCOPY W/ RETROGRADES Bilateral 11/01/2016   Procedure: CYSTOSCOPY WITH RETROGRADE PYELOGRAM;  Surgeon: Cleon Gustin, MD;  Location: [facility];  Service: Urology;  Laterality: Bilateral;  . LEFT HEART CATH AND CORONARY ANGIOGRAPHY N/A 12/27/2016   Procedure: Left Heart Cath and Coronary Angiography;  Surgeon: Lorretta Harp, MD;  Location: [facility];  Service: Cardiovascular;  Laterality: N/A;  . STONE EXTRACTION WITH BASKET  07/21/2011   Procedure: STONE EXTRACTION WITH BASKET;  Surgeon: Marissa Nestle, MD;  Location: [facility];  Service: Urology;   Laterality: Left;  . TRANSURETHRAL RESECTION OF BLADDER TUMOR N/A 11/01/2016   Procedure: TRANSURETHRAL RESECTION OF BLADDER TUMOR (TURBT);  Surgeon: Cleon Gustin, MD;  Location: [facility];  Service: Urology;  Laterality: N/A;    Allergies  Allergen Reactions  . Penicillins Anaphylaxis, Hives and Swelling    Has patient had a PCN reaction causing immediate rash, facial/tongue/throat swelling, SOB or lightheadedness with hypotension: Yes Has patient had a PCN reaction causing severe rash involving mucus membranes or skin necrosis: Yes Has patient had a PCN reaction that required hospitalization: No Has patient had a PCN reaction occurring within the last 10 years: No If all of the above answers are "NO", then may proceed with Cephalosporin use.     Current Outpatient Medications on File Prior to Visit  Medication Sig Dispense Refill  . allopurinol (ZYLOPRIM) 300 MG tablet Take 300 mg by mouth daily.    Marland Kitchen aspirin EC 81 MG tablet Take 81 mg by mouth daily.    Marland Kitchen atorvastatin (LIPITOR) 40 MG tablet Take 1 tablet (40 mg total) by mouth daily at 6 PM. 30 tablet 1  . Bioflavonoid Products (ESTER-C) 1000-50 MG TABS Take 1 tablet by mouth daily.    . citalopram (CELEXA) 40 MG tablet Take 40 mg by mouth daily.    Marland Kitchen HYDROcodone-acetaminophen (NORCO) 7.5-325 MG per tablet Take 0.5-1 tablets by mouth every 6 (six)  hours as needed for moderate pain.    Marland Kitchen lisinopril (PRINIVIL,ZESTRIL) 10 MG tablet Take 10 mg by mouth daily.    . meloxicam (MOBIC) 15 MG tablet Take 15 mg by mouth daily.    . Multiple Vitamin (MULITIVITAMIN WITH MINERALS) TABS Take 1 tablet by mouth daily.    . pioglitazone-metformin (ACTOPLUS MET) 15-850 MG tablet Take 1 tablet by mouth 2 (two) times daily with a meal.    . tiZANidine (ZANAFLEX) 4 MG tablet Take 4 mg by mouth every 6 (six) hours as needed for muscle spasms.    . TRULICITY 1.5 XT/0.5WP SOPN Inject 1.5 mg into the muscle every Saturday.  11   No current  facility-administered medications on file prior to visit.          Objective:   Physical Exam Blood pressure (!) 180/72, pulse 72, temperature 98 F (36.7 C), height 5' 9.5" (1.765 m), weight 277 lb 6.4 oz (125.8 kg). Alert and oriented. Skin warm and dry. Oral mucosa is moist.   . Sclera anicteric, conjunctivae is pink. Thyroid not enlarged. No cervical lymphadenopathy. Lungs clear. Heart regular rate and rhythm.  Abdomen is soft. Bowel sounds are positive. No hepatomegaly. No abdominal masses felt. No tenderness.  No edema to lower extremities. Morbidily obese         Assessment & Plan:  Guaiac positive stool. Colonic neoplasm needs to be ruled. Hx of colonic polyps.

## 2017-07-11 NOTE — Telephone Encounter (Signed)
Patient needs trilyte 

## 2017-07-11 NOTE — Patient Instructions (Signed)
Colonoscopy. The risks of bleeding, perforation and infection were reviewed with patient.  

## 2017-07-22 ENCOUNTER — Other Ambulatory Visit: Payer: Self-pay

## 2017-07-22 ENCOUNTER — Encounter (HOSPITAL_COMMUNITY): Payer: Self-pay | Admitting: *Deleted

## 2017-07-22 ENCOUNTER — Ambulatory Visit (HOSPITAL_COMMUNITY)
Admission: RE | Admit: 2017-07-22 | Discharge: 2017-07-22 | Disposition: A | Discharge status: Home / Self Care | Payer: BLUE CROSS/BLUE SHIELD | Source: Ambulatory Visit | Attending: Internal Medicine | Admitting: Internal Medicine

## 2017-07-22 ENCOUNTER — Encounter (HOSPITAL_COMMUNITY)
Admission: RE | Disposition: A | Discharge status: Home / Self Care | Payer: Self-pay | Source: Ambulatory Visit | Attending: Internal Medicine

## 2017-07-22 DIAGNOSIS — R195 Other fecal abnormalities: Secondary | ICD-10-CM | POA: Diagnosis not present

## 2017-07-22 DIAGNOSIS — Z87891 Personal history of nicotine dependence: Secondary | ICD-10-CM | POA: Insufficient documentation

## 2017-07-22 DIAGNOSIS — K5790 Diverticulosis of intestine, part unspecified, without perforation or abscess without bleeding: Secondary | ICD-10-CM | POA: Insufficient documentation

## 2017-07-22 DIAGNOSIS — I10 Essential (primary) hypertension: Secondary | ICD-10-CM | POA: Insufficient documentation

## 2017-07-22 DIAGNOSIS — Z7982 Long term (current) use of aspirin: Secondary | ICD-10-CM | POA: Diagnosis not present

## 2017-07-22 DIAGNOSIS — E1140 Type 2 diabetes mellitus with diabetic neuropathy, unspecified: Secondary | ICD-10-CM | POA: Insufficient documentation

## 2017-07-22 DIAGNOSIS — Z79899 Other long term (current) drug therapy: Secondary | ICD-10-CM | POA: Insufficient documentation

## 2017-07-22 DIAGNOSIS — K5730 Diverticulosis of large intestine without perforation or abscess without bleeding: Secondary | ICD-10-CM | POA: Diagnosis not present

## 2017-07-22 DIAGNOSIS — K921 Melena: Secondary | ICD-10-CM | POA: Insufficient documentation

## 2017-07-22 DIAGNOSIS — Z880 Allergy status to penicillin: Secondary | ICD-10-CM | POA: Diagnosis not present

## 2017-07-22 DIAGNOSIS — K219 Gastro-esophageal reflux disease without esophagitis: Secondary | ICD-10-CM | POA: Diagnosis not present

## 2017-07-22 DIAGNOSIS — D125 Benign neoplasm of sigmoid colon: Secondary | ICD-10-CM | POA: Diagnosis not present

## 2017-07-22 DIAGNOSIS — M109 Gout, unspecified: Secondary | ICD-10-CM | POA: Diagnosis not present

## 2017-07-22 DIAGNOSIS — Z86010 Personal history of colonic polyps: Secondary | ICD-10-CM | POA: Insufficient documentation

## 2017-07-22 DIAGNOSIS — K5900 Constipation, unspecified: Secondary | ICD-10-CM | POA: Insufficient documentation

## 2017-07-22 DIAGNOSIS — Z7984 Long term (current) use of oral hypoglycemic drugs: Secondary | ICD-10-CM | POA: Insufficient documentation

## 2017-07-22 DIAGNOSIS — K648 Other hemorrhoids: Secondary | ICD-10-CM | POA: Diagnosis not present

## 2017-07-22 DIAGNOSIS — Z87442 Personal history of urinary calculi: Secondary | ICD-10-CM | POA: Insufficient documentation

## 2017-07-22 HISTORY — PX: COLONOSCOPY: SHX5424

## 2017-07-22 HISTORY — PX: POLYPECTOMY: SHX5525

## 2017-07-22 LAB — GLUCOSE, CAPILLARY: Glucose-Capillary: 106 mg/dL — ABNORMAL HIGH (ref 65–99)

## 2017-07-22 SURGERY — COLONOSCOPY
Anesthesia: Moderate Sedation

## 2017-07-22 MED ORDER — STERILE WATER FOR IRRIGATION IR SOLN
Status: DC | PRN
  Administered 2017-07-22: 14:00:00

## 2017-07-22 MED ORDER — MEPERIDINE HCL 50 MG/ML IJ SOLN
INTRAMUSCULAR | Status: AC
  Filled 2017-07-22: qty 1

## 2017-07-22 MED ORDER — MIDAZOLAM HCL 5 MG/5ML IJ SOLN
INTRAMUSCULAR | Status: DC | PRN
  Administered 2017-07-22 (×4): 2 mg via INTRAVENOUS

## 2017-07-22 MED ORDER — SODIUM CHLORIDE 0.9 % IV SOLN
INTRAVENOUS | Status: DC
  Administered 2017-07-22: 13:00:00 via INTRAVENOUS

## 2017-07-22 MED ORDER — MIDAZOLAM HCL 5 MG/5ML IJ SOLN
INTRAMUSCULAR | Status: AC
  Filled 2017-07-22: qty 10

## 2017-07-22 MED ORDER — MEPERIDINE HCL 50 MG/ML IJ SOLN
INTRAMUSCULAR | Status: DC | PRN
  Administered 2017-07-22 (×2): 25 mg via INTRAVENOUS

## 2017-07-22 NOTE — Discharge Instructions (Signed)
Colonoscopy, Adult, Care After This sheet gives you information about how to care for yourself after your procedure. Your health care provider may also give you more specific instructions. If you have problems or questions, contact your health care provider. What can I expect after the procedure? After the procedure, it is common to have:  A small amount of blood in your stool for 24 hours after the procedure.  Some gas.  Mild abdominal cramping or bloating.  Follow these instructions at home: General instructions   For the first 24 hours after the procedure: ? Do not drive or use machinery. ? Do not sign important documents. ? Do not drink alcohol. ? Do your regular daily activities at a slower pace than normal. ? Eat soft, easy-to-digest foods. ? Rest often.  Take over-the-counter or prescription medicines only as told by your health care provider.  It is up to you to get the results of your procedure. Ask your health care provider, or the department performing the procedure, when your results will be ready. Relieving cramping and bloating  Try walking around when you have cramps or feel bloated.  Apply heat to your abdomen as told by your health care provider. Use a heat source that your health care provider recommends, such as a moist heat pack or a heating pad. ? Place a towel between your skin and the heat source. ? Leave the heat on for 20-30 minutes. ? Remove the heat if your skin turns bright red. This is especially important if you are unable to feel pain, heat, or cold. You may have a greater risk of getting burned. Eating and drinking  Drink enough fluid to keep your urine clear or pale yellow.  Resume your normal diet as instructed by your health care provider. Avoid heavy or fried foods that are hard to digest.  Avoid drinking alcohol for as long as instructed by your health care provider. Contact a health care provider if:  You have blood in your stool 2-3  days after the procedure. Get help right away if:  You have more than a small spotting of blood in your stool.  You pass large blood clots in your stool.  Your abdomen is swollen.  You have nausea or vomiting.  You have a fever.  You have increasing abdominal pain that is not relieved with medicine. This information is not intended to replace advice given to you by your health care provider. Make sure you discuss any questions you have with your health care provider. Document Released: 12/30/2003 Document Revised: 02/09/2016 Document Reviewed: 07/29/2015 Elsevier Interactive Patient Education  2018 [employer]. Colon Polyps Polyps are tissue growths inside the body. Polyps can grow in many places, including the large intestine (colon). A polyp may be a round bump or a mushroom-shaped growth. You could have one polyp or several. Most colon polyps are noncancerous (benign). However, some colon polyps can become cancerous over time. What are the causes? The exact cause of colon polyps is not known. What increases the risk? This condition is more likely to develop in people who:  Have a family history of colon cancer or colon polyps.  Are older than 65 or older than 45 if they are African American.  Have inflammatory bowel disease, such as ulcerative colitis or Crohn disease.  Are overweight.  Smoke cigarettes.  Do not get enough exercise.  Drink too much alcohol.  Eat a diet that is: ? High in fat and red meat. ? Low in  fiber.  Had childhood cancer that was treated with abdominal radiation.  What are the signs or symptoms? Most polyps do not cause symptoms. If you have symptoms, they may include:  Blood coming from your rectum when having a bowel movement.  Blood in your stool.The stool may look dark red or black.  A change in bowel habits, such as constipation or diarrhea.  How is this diagnosed? This condition is diagnosed with a colonoscopy. This is a  procedure that uses a lighted, flexible scope to look at the inside of your colon. How is this treated? Treatment for this condition involves removing any polyps that are found. Those polyps will then be tested for cancer. If cancer is found, your health care provider will talk to you about options for colon cancer treatment. Follow these instructions at home: Diet  Eat plenty of fiber, such as fruits, vegetables, and whole grains.  Eat foods that are high in calcium and vitamin D, such as milk, cheese, yogurt, eggs, liver, fish, and broccoli.  Limit foods high in fat, red meats, and processed meats, such as hot dogs, sausage, bacon, and lunch meats.  Maintain a healthy weight, or lose weight if recommended by your health care provider. General instructions  Do not smoke cigarettes.  Do not drink alcohol excessively.  Keep all follow-up visits as told by your health care provider. This is important. This includes keeping regularly scheduled colonoscopies. Talk to your health care provider about when you need a colonoscopy.  Exercise every day or as told by your health care provider. Contact a health care provider if:  You have new or worsening bleeding during a bowel movement.  You have new or increased blood in your stool.  You have a change in bowel habits.  You unexpectedly lose weight. This information is not intended to replace advice given to you by your health care provider. Make sure you discuss any questions you have with your health care provider. Document Released: 02/11/2004 Document Revised: 10/23/2015 Document Reviewed: 04/07/2015 Elsevier Interactive Patient Education  2018 [employer]. Resume aspirin on 07/29/2017 and meloxicam on 07/26/2017. Resume other medications as before. High fiber diet. No driving for 24 hours. Physician will call with biopsy results.

## 2017-07-22 NOTE — Op Note (Signed)
[facility] Patient Name: Samuel Caldwell Procedure Date: 07/22/2017 1:20 PM MRN: [id number] Date of Birth: 08-21-1960 Attending MD: Hildred Laser , MD CSN: [id number] Age: 57 Admit Type: Outpatient Procedure:                Colonoscopy Indications:              Heme positive stool, History of small sessile                            serrated polyp. Providers:                Hildred Laser, MD, Jeanann Lewandowsky. Sharon Seller, RN, Randa Spike, Technician Referring MD:             Glenda Chroman, MD Medicines:                Meperidine 50 mg IV, Midazolam 8 mg IV Complications:            No immediate complications. Estimated Blood Loss:     Estimated blood loss was minimal. Procedure:                Pre-Anesthesia Assessment:                           - Prior to the procedure, a History and Physical                            was performed, and patient medications and                            allergies were reviewed. The patient's tolerance of                            previous anesthesia was also reviewed. The risks                            and benefits of the procedure and the sedation                            options and risks were discussed with the patient.                            All questions were answered, and informed consent                            was obtained. Prior Anticoagulants: The patient                            last took aspirin 2 days and previous NSAID                            medication 1 day prior to the procedure. ASA Grade  Assessment: II - A patient with mild systemic                            disease. After reviewing the risks and benefits,                            the patient was deemed in satisfactory condition to                            undergo the procedure.                           After obtaining informed consent, the colonoscope                            was passed under direct  vision. Throughout the                            procedure, the patient's blood pressure, pulse, and                            oxygen saturations were monitored continuously. The                            EC-[id number] ([id number]) scope was introduced through                            the anus and advanced to the the cecum, identified                            by appendiceal orifice and ileocecal valve. The                            colonoscopy was performed without difficulty. The                            patient tolerated the procedure well. The quality                            of the bowel preparation was excellent. The                            ileocecal valve, appendiceal orifice, and rectum                            were photographed. Scope In: 1:41:47 PM Scope Out: 2:07:57 PM Scope Withdrawal Time: 0 hours 20 minutes 21 seconds  Total Procedure Duration: 0 hours 26 minutes 10 seconds  Findings:      The perianal and digital rectal examinations were normal.      A 7 mm polyp was found in the mid sigmoid colon. The polyp was       semi-sessile. The polyp was removed with a hot snare. Resection and       retrieval were complete. The pathology specimen was placed into  Bottle       Number 1.      A small polyp was found in the distal sigmoid colon. The polyp was       sessile. The polyp was removed with a cold snare. Resection and       retrieval were complete. The pathology specimen was placed into Bottle       Number 2.      Scattered small-mouthed diverticula were found in the sigmoid colon.      Internal hemorrhoids were found during retroflexion. The hemorrhoids       were medium-sized. Impression:               - One 7 mm polyp in the mid sigmoid colon, removed                            with a hot snare. Resected and retrieved.                           - One small polyp in the distal sigmoid colon,                            removed with a cold snare. Resected and  retrieved.                           - Diverticulosis in the sigmoid colon.                           - Internal hemorrhoids. Moderate Sedation:      Moderate (conscious) sedation was administered by the endoscopy nurse       and supervised by the endoscopist. The following parameters were       monitored: oxygen saturation, heart rate, blood pressure, CO2       capnography and response to care. Total physician intraservice time was       31 minutes. Recommendation:           - Patient has a contact number available for                            emergencies. The signs and symptoms of potential                            delayed complications were discussed with the                            patient. Return to normal activities tomorrow.                            Written discharge instructions were provided to the                            patient.                           - High fiber diet today.                           -  Continue present medications.                           - Resume aspirin at prior dose in 1 week.                           - Await pathology results.                           - Repeat colonoscopy is recommended. The                            colonoscopy date will be determined after pathology                            results from today's exam become available for                            review. Procedure Code(s):        --- Professional ---                           [phone], Colonoscopy, flexible; with removal of                            tumor(s), polyp(s), or other lesion(s) by snare                            technique                           99152, Moderate sedation services provided by the                            same physician or other qualified health care                            professional performing the diagnostic or                            therapeutic service that the sedation supports,                            requiring the  presence of an independent trained                            observer to assist in the monitoring of the                            patient's level of consciousness and physiological                            status; initial 15 minutes of intraservice time,                            patient age  5 years or older                           [phone], Moderate sedation services; each additional                            15 minutes intraservice time Diagnosis Code(s):        --- Professional ---                           D12.5, Benign neoplasm of sigmoid colon                           K64.8, Other hemorrhoids                           R19.5, Other fecal abnormalities                           K57.30, Diverticulosis of large intestine without                            perforation or abscess without bleeding CPT copyright 2016 [employer]. All rights reserved. The codes documented in this report are preliminary and upon coder review may  be revised to meet current compliance requirements. Hildred Laser, MD Hildred Laser, MD 07/22/2017 2:19:18 PM This report has been signed electronically. Number of Addenda: 0

## 2017-07-22 NOTE — H&P (Addendum)
Samuel Jeff Sr. is an 57 y.o. male.   Chief Complaint: Patient is here for colonoscopy. HPI: Patient is a 57 year old Caucasian male who was noted to have heme positive stool on routine testing.  He has intermittent hematochezia.  When this occurs he passes small amount of blood with his bowel movements.  He is prone to constipation but not bad.  He denies abdominal pain. Last colonoscopy was in March 2015 with removal of small polyp and was sessile serrated polyp. Last aspirin dose was 2 days ago. Last meloxicam dose was yesterday. Family history is negative for CRC.  Past Medical History:  Diagnosis Date  . History of kidney stones      . Diabetes mellitus   . GERD (gastroesophageal reflux disease)   . Gout   .    .    . Hypertension   . Neuropathy     Past Surgical History:  Procedure Laterality Date  . BACK SURGERY     l4 anf l5  . CARPAL TUNNEL RELEASE     left  . COLONOSCOPY N/A 08/16/2013   Procedure: COLONOSCOPY;  Surgeon: Rogene Houston, MD;  Location: [facility];  Service: Endoscopy;  Laterality: N/A;  930  . CYSTOSCOPY W/ RETROGRADES Bilateral 11/01/2016   Procedure: CYSTOSCOPY WITH RETROGRADE PYELOGRAM;  Surgeon: Cleon Gustin, MD;  Location: [facility];  Service: Urology;  Laterality: Bilateral;  . LEFT HEART CATH AND CORONARY ANGIOGRAPHY N/A 12/27/2016   Procedure: Left Heart Cath and Coronary Angiography;  Surgeon: Lorretta Harp, MD;  Location: [facility];  Service: Cardiovascular;  Laterality: N/A;  . STONE EXTRACTION WITH BASKET  07/21/2011   Procedure: STONE EXTRACTION WITH BASKET;  Surgeon: Marissa Nestle, MD;  Location: [facility];  Service: Urology;  Laterality: Left;  . TRANSURETHRAL RESECTION OF BLADDER TUMOR N/A 11/01/2016   Procedure: TRANSURETHRAL RESECTION OF BLADDER TUMOR (TURBT);  Surgeon: Cleon Gustin, MD;  Location: [facility];  Service: Urology;  Laterality: N/A;    Family History  Problem Relation Age of Onset  .  Anesthesia problems Neg Hx   . Hypotension Neg Hx   . Malignant hyperthermia Neg Hx   . Pseudochol deficiency Neg Hx   . Colon cancer Neg Hx    Social History:  reports that he quit smoking about 20 years ago. His smoking use included cigarettes. He has a 10.00 pack-year smoking history. He has quit using smokeless tobacco. His smokeless tobacco use included snuff. He reports that he drinks alcohol. He reports that he does not use drugs.  Allergies:  Allergies  Allergen Reactions  . Penicillins Anaphylaxis, Hives and Swelling    Has patient had a PCN reaction causing immediate rash, facial/tongue/throat swelling, SOB or lightheadedness with hypotension: Yes Has patient had a PCN reaction causing severe rash involving mucus membranes or skin necrosis: Yes Has patient had a PCN reaction that required hospitalization: No Has patient had a PCN reaction occurring within the last 10 years: No If all of the above answers are "NO", then may proceed with Cephalosporin use.     Medications Prior to Admission  Medication Sig Dispense Refill  . allopurinol (ZYLOPRIM) 300 MG tablet Take 300 mg by mouth daily.    Marland Kitchen aspirin EC 81 MG tablet Take 81 mg by mouth daily.    Marland Kitchen atorvastatin (LIPITOR) 40 MG tablet Take 1 tablet (40 mg total) by mouth daily at 6 PM. 30 tablet 1  . Bioflavonoid Products (ESTER-C) 1000-50 MG  TABS Take 1 tablet by mouth daily.    . citalopram (CELEXA) 40 MG tablet Take 40 mg by mouth daily.    Marland Kitchen HYDROcodone-acetaminophen (NORCO/VICODIN) 5-325 MG tablet Take 0.5-1 tablets by mouth every 6 (six) hours as needed for moderate pain.    Marland Kitchen lisinopril (PRINIVIL,ZESTRIL) 20 MG tablet Take 20 mg by mouth daily.     . meloxicam (MOBIC) 15 MG tablet Take 15 mg by mouth daily.    . Multiple Vitamin (MULITIVITAMIN WITH MINERALS) TABS Take 1 tablet by mouth daily.    . pioglitazone-metformin (ACTOPLUS MET) 15-850 MG tablet Take 1 tablet by mouth 2 (two) times daily with a meal.    .  tiZANidine (ZANAFLEX) 4 MG tablet Take 4 mg by mouth every 6 (six) hours as needed for muscle spasms.    . TRULICITY 1.5 TK/1.6WF SOPN Inject 1.5 mg into the muscle every Saturday.  11    Results for orders placed or performed during the hospital encounter of 07/22/17 (from the past 48 hour(s))  Glucose, capillary     Status: Abnormal   Collection Time: 07/22/17 12:44 PM  Result Value Ref Range   Glucose-Capillary 106 (H) 65 - 99 mg/dL   No results found.  ROS  Blood pressure 133/76, pulse 81, temperature 98.6 F (37 C), temperature source Oral, resp. rate (!) 26, height 5' 9.5" (1.765 m), weight 277 lb (125.6 kg), SpO2 97 %. Physical Exam  Constitutional: He appears well-developed and well-nourished.  HENT:  Mouth/Throat: Oropharynx is clear and moist.  Eyes: Conjunctivae are normal. No scleral icterus.  Neck: No thyromegaly present.  Cardiovascular: Normal rate, regular rhythm and normal heart sounds.  No murmur heard. Respiratory: Effort normal and breath sounds normal.  GI:  Abdomen is full.  It is soft and nontender without organomegaly or masses.  Musculoskeletal: He exhibits no edema.  Lymphadenopathy:    He has no cervical adenopathy.  Neurological: He is alert.  Skin: Skin is warm and dry.  He has multiple tattoos over his upper extremities.     Assessment/Plan Heme positive stool. History of sessile serrated polyp. Diagnostic colonoscopy.  Hildred Laser, MD 07/22/2017, 1:31 PM

## 2017-08-01 ENCOUNTER — Encounter (HOSPITAL_COMMUNITY): Payer: Self-pay | Admitting: Internal Medicine

## 2024-06-21 ENCOUNTER — Encounter (INDEPENDENT_AMBULATORY_CARE_PROVIDER_SITE_OTHER): Payer: Self-pay | Admitting: *Deleted
